# Patient Record
Sex: Female | Born: 1940 | Race: White | Hispanic: No | Marital: Married | Smoking: Former smoker
Health system: Southern US, Community
[De-identification: ages and names within clinical notes are randomized; demographics above are authoritative.]

## PROBLEM LIST (undated history)

## (undated) DIAGNOSIS — I10 Essential (primary) hypertension: Secondary | ICD-10-CM

## (undated) DIAGNOSIS — F329 Major depressive disorder, single episode, unspecified: Secondary | ICD-10-CM

## (undated) DIAGNOSIS — R7303 Prediabetes: Secondary | ICD-10-CM

## (undated) DIAGNOSIS — C189 Malignant neoplasm of colon, unspecified: Secondary | ICD-10-CM

## (undated) DIAGNOSIS — F32A Depression, unspecified: Secondary | ICD-10-CM

## (undated) HISTORY — PX: BREAST BIOPSY: SHX20

---
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
## 2008-12-06 ENCOUNTER — Ambulatory Visit: Payer: Self-pay | Admitting: Internal Medicine

## 2009-12-13 ENCOUNTER — Ambulatory Visit: Payer: Self-pay | Admitting: Internal Medicine

## 2010-08-16 ENCOUNTER — Ambulatory Visit: Payer: Self-pay | Admitting: Gastroenterology

## 2011-01-02 ENCOUNTER — Ambulatory Visit: Payer: Self-pay | Admitting: Internal Medicine

## 2011-12-16 ENCOUNTER — Ambulatory Visit: Payer: Self-pay | Admitting: Internal Medicine

## 2012-02-20 ENCOUNTER — Ambulatory Visit: Payer: Self-pay | Admitting: Internal Medicine

## 2013-02-22 ENCOUNTER — Ambulatory Visit: Payer: Self-pay | Admitting: Internal Medicine

## 2014-06-29 DIAGNOSIS — E538 Deficiency of other specified B group vitamins: Secondary | ICD-10-CM | POA: Insufficient documentation

## 2014-09-13 ENCOUNTER — Ambulatory Visit: Payer: Self-pay | Admitting: Internal Medicine

## 2015-02-02 DIAGNOSIS — R739 Hyperglycemia, unspecified: Secondary | ICD-10-CM | POA: Insufficient documentation

## 2015-11-13 DIAGNOSIS — Z85828 Personal history of other malignant neoplasm of skin: Secondary | ICD-10-CM | POA: Insufficient documentation

## 2016-07-16 ENCOUNTER — Other Ambulatory Visit: Payer: Self-pay | Admitting: Internal Medicine

## 2016-07-16 DIAGNOSIS — Z1231 Encounter for screening mammogram for malignant neoplasm of breast: Secondary | ICD-10-CM

## 2016-08-06 ENCOUNTER — Ambulatory Visit
Admission: RE | Admit: 2016-08-06 | Discharge: 2016-08-06 | Disposition: A | Discharge status: Home / Self Care | Payer: Medicare Other | Source: Ambulatory Visit | Attending: Internal Medicine | Admitting: Internal Medicine

## 2016-08-06 ENCOUNTER — Encounter: Payer: Self-pay | Admitting: Radiology

## 2016-08-06 DIAGNOSIS — Z1231 Encounter for screening mammogram for malignant neoplasm of breast: Secondary | ICD-10-CM | POA: Insufficient documentation

## 2017-10-10 ENCOUNTER — Other Ambulatory Visit: Payer: Self-pay | Admitting: Internal Medicine

## 2017-10-10 DIAGNOSIS — Z1231 Encounter for screening mammogram for malignant neoplasm of breast: Secondary | ICD-10-CM

## 2017-11-04 ENCOUNTER — Ambulatory Visit
Admission: RE | Admit: 2017-11-04 | Discharge: 2017-11-04 | Disposition: A | Discharge status: Home / Self Care | Payer: Medicare Other | Source: Ambulatory Visit | Attending: Internal Medicine | Admitting: Internal Medicine

## 2017-11-04 DIAGNOSIS — R928 Other abnormal and inconclusive findings on diagnostic imaging of breast: Secondary | ICD-10-CM | POA: Diagnosis not present

## 2017-11-04 DIAGNOSIS — Z1231 Encounter for screening mammogram for malignant neoplasm of breast: Secondary | ICD-10-CM | POA: Diagnosis present

## 2017-11-04 DIAGNOSIS — N6320 Unspecified lump in the left breast, unspecified quadrant: Secondary | ICD-10-CM | POA: Diagnosis not present

## 2017-11-07 ENCOUNTER — Other Ambulatory Visit: Payer: Self-pay | Admitting: Internal Medicine

## 2017-11-07 DIAGNOSIS — R928 Other abnormal and inconclusive findings on diagnostic imaging of breast: Secondary | ICD-10-CM

## 2017-11-07 DIAGNOSIS — N6320 Unspecified lump in the left breast, unspecified quadrant: Secondary | ICD-10-CM

## 2017-11-13 ENCOUNTER — Ambulatory Visit
Admission: RE | Admit: 2017-11-13 | Discharge: 2017-11-13 | Disposition: A | Discharge status: Home / Self Care | Payer: Medicare Other | Source: Ambulatory Visit | Attending: Internal Medicine | Admitting: Internal Medicine

## 2017-11-13 DIAGNOSIS — N6320 Unspecified lump in the left breast, unspecified quadrant: Secondary | ICD-10-CM | POA: Diagnosis present

## 2017-11-13 DIAGNOSIS — R928 Other abnormal and inconclusive findings on diagnostic imaging of breast: Secondary | ICD-10-CM

## 2017-11-13 DIAGNOSIS — N6002 Solitary cyst of left breast: Secondary | ICD-10-CM | POA: Diagnosis not present

## 2017-11-19 ENCOUNTER — Ambulatory Visit: Payer: Medicare Other

## 2017-11-19 ENCOUNTER — Other Ambulatory Visit: Payer: Medicare Other

## 2019-10-26 ENCOUNTER — Other Ambulatory Visit: Payer: Self-pay | Admitting: Internal Medicine

## 2019-10-26 DIAGNOSIS — R918 Other nonspecific abnormal finding of lung field: Secondary | ICD-10-CM

## 2019-11-04 DIAGNOSIS — E118 Type 2 diabetes mellitus with unspecified complications: Secondary | ICD-10-CM | POA: Insufficient documentation

## 2019-11-04 DIAGNOSIS — Z789 Other specified health status: Secondary | ICD-10-CM | POA: Insufficient documentation

## 2019-11-05 ENCOUNTER — Ambulatory Visit
Admission: RE | Admit: 2019-11-05 | Discharge: 2019-11-05 | Disposition: A | Discharge status: Home / Self Care | Payer: Medicare Other | Source: Ambulatory Visit | Attending: Internal Medicine | Admitting: Internal Medicine

## 2019-11-05 ENCOUNTER — Other Ambulatory Visit: Payer: Self-pay

## 2019-11-05 DIAGNOSIS — R918 Other nonspecific abnormal finding of lung field: Secondary | ICD-10-CM | POA: Diagnosis present

## 2019-11-05 HISTORY — DX: Essential (primary) hypertension: I10

## 2019-11-05 MED ORDER — IOHEXOL 300 MG/ML  SOLN
75.0000 mL | Freq: Once | INTRAMUSCULAR | Status: AC | PRN
  Administered 2019-11-05: 75 mL via INTRAVENOUS

## 2019-11-08 ENCOUNTER — Other Ambulatory Visit: Payer: Self-pay | Admitting: Internal Medicine

## 2019-11-08 DIAGNOSIS — R160 Hepatomegaly, not elsewhere classified: Secondary | ICD-10-CM

## 2019-11-19 ENCOUNTER — Ambulatory Visit
Admission: RE | Admit: 2019-11-19 | Discharge: 2019-11-19 | Disposition: A | Discharge status: Home / Self Care | Payer: Medicare Other | Source: Ambulatory Visit | Attending: Internal Medicine | Admitting: Internal Medicine

## 2019-11-19 ENCOUNTER — Other Ambulatory Visit: Payer: Self-pay

## 2019-11-19 DIAGNOSIS — R160 Hepatomegaly, not elsewhere classified: Secondary | ICD-10-CM

## 2019-11-19 MED ORDER — IOHEXOL 300 MG/ML  SOLN
100.0000 mL | Freq: Once | INTRAMUSCULAR | Status: AC | PRN
  Administered 2019-11-19: 13:00:00 100 mL via INTRAVENOUS

## 2019-11-25 ENCOUNTER — Inpatient Hospital Stay: Payer: Medicare Other

## 2019-11-25 ENCOUNTER — Other Ambulatory Visit: Payer: Self-pay

## 2019-11-25 ENCOUNTER — Telehealth: Payer: Self-pay | Admitting: Emergency Medicine

## 2019-11-25 ENCOUNTER — Inpatient Hospital Stay: Payer: Medicare Other | Attending: Oncology | Admitting: Oncology

## 2019-11-25 VITALS — BP 138/85 | HR 88 | Temp 97.5°F | Resp 18 | Wt 209.9 lb

## 2019-11-25 DIAGNOSIS — I10 Essential (primary) hypertension: Secondary | ICD-10-CM | POA: Insufficient documentation

## 2019-11-25 DIAGNOSIS — M549 Dorsalgia, unspecified: Secondary | ICD-10-CM | POA: Insufficient documentation

## 2019-11-25 DIAGNOSIS — R110 Nausea: Secondary | ICD-10-CM | POA: Insufficient documentation

## 2019-11-25 DIAGNOSIS — R160 Hepatomegaly, not elsewhere classified: Secondary | ICD-10-CM | POA: Insufficient documentation

## 2019-11-25 DIAGNOSIS — C787 Secondary malignant neoplasm of liver and intrahepatic bile duct: Secondary | ICD-10-CM | POA: Insufficient documentation

## 2019-11-25 DIAGNOSIS — R918 Other nonspecific abnormal finding of lung field: Secondary | ICD-10-CM | POA: Diagnosis present

## 2019-11-25 DIAGNOSIS — C189 Malignant neoplasm of colon, unspecified: Secondary | ICD-10-CM | POA: Insufficient documentation

## 2019-11-25 DIAGNOSIS — G4700 Insomnia, unspecified: Secondary | ICD-10-CM | POA: Diagnosis not present

## 2019-11-25 DIAGNOSIS — R634 Abnormal weight loss: Secondary | ICD-10-CM | POA: Diagnosis not present

## 2019-11-25 DIAGNOSIS — R630 Anorexia: Secondary | ICD-10-CM | POA: Diagnosis not present

## 2019-11-25 DIAGNOSIS — C7800 Secondary malignant neoplasm of unspecified lung: Secondary | ICD-10-CM | POA: Insufficient documentation

## 2019-11-25 DIAGNOSIS — F329 Major depressive disorder, single episode, unspecified: Secondary | ICD-10-CM | POA: Insufficient documentation

## 2019-11-25 DIAGNOSIS — S8290XA Unspecified fracture of unspecified lower leg, initial encounter for closed fracture: Secondary | ICD-10-CM | POA: Insufficient documentation

## 2019-11-25 DIAGNOSIS — F32A Depression, unspecified: Secondary | ICD-10-CM | POA: Insufficient documentation

## 2019-11-25 DIAGNOSIS — E7800 Pure hypercholesterolemia, unspecified: Secondary | ICD-10-CM | POA: Insufficient documentation

## 2019-11-25 DIAGNOSIS — K5732 Diverticulitis of large intestine without perforation or abscess without bleeding: Secondary | ICD-10-CM | POA: Insufficient documentation

## 2019-11-25 LAB — CBC WITH DIFFERENTIAL/PLATELET
Abs Immature Granulocytes: 0.05 10*3/uL (ref 0.00–0.07)
Basophils Absolute: 0 10*3/uL (ref 0.0–0.1)
Basophils Relative: 0 %
Eosinophils Absolute: 0 10*3/uL (ref 0.0–0.5)
Eosinophils Relative: 0 %
HCT: 38.5 % (ref 36.0–46.0)
Hemoglobin: 11.9 g/dL — ABNORMAL LOW (ref 12.0–15.0)
Immature Granulocytes: 0 %
Lymphocytes Relative: 12 %
Lymphs Abs: 1.5 10*3/uL (ref 0.7–4.0)
MCH: 25.8 pg — ABNORMAL LOW (ref 26.0–34.0)
MCHC: 30.9 g/dL (ref 30.0–36.0)
MCV: 83.3 fL (ref 80.0–100.0)
Monocytes Absolute: 1.2 10*3/uL — ABNORMAL HIGH (ref 0.1–1.0)
Monocytes Relative: 10 %
Neutro Abs: 9.6 10*3/uL — ABNORMAL HIGH (ref 1.7–7.7)
Neutrophils Relative %: 78 %
Platelets: 263 10*3/uL (ref 150–400)
RBC: 4.62 MIL/uL (ref 3.87–5.11)
RDW: 14.2 % (ref 11.5–15.5)
WBC: 12.4 10*3/uL — ABNORMAL HIGH (ref 4.0–10.5)
nRBC: 0 % (ref 0.0–0.2)

## 2019-11-25 LAB — COMPREHENSIVE METABOLIC PANEL
ALT: 51 U/L — ABNORMAL HIGH (ref 0–44)
AST: 61 U/L — ABNORMAL HIGH (ref 15–41)
Albumin: 3.1 g/dL — ABNORMAL LOW (ref 3.5–5.0)
Alkaline Phosphatase: 215 U/L — ABNORMAL HIGH (ref 38–126)
Anion gap: 12 (ref 5–15)
BUN: 16 mg/dL (ref 8–23)
CO2: 24 mmol/L (ref 22–32)
Calcium: 9.3 mg/dL (ref 8.9–10.3)
Chloride: 97 mmol/L — ABNORMAL LOW (ref 98–111)
Creatinine, Ser: 0.83 mg/dL (ref 0.44–1.00)
GFR calc Af Amer: >60 mL/min (ref >60)
GFR calc non Af Amer: >60 mL/min (ref >60)
Glucose, Bld: 151 mg/dL — ABNORMAL HIGH (ref 70–99)
Potassium: 3.5 mmol/L (ref 3.5–5.1)
Sodium: 133 mmol/L — ABNORMAL LOW (ref 135–145)
Total Bilirubin: 1.1 mg/dL (ref 0.3–1.2)
Total Protein: 7 g/dL (ref 6.5–8.1)

## 2019-11-25 LAB — PROTIME-INR
INR: 1.1 (ref 0.8–1.2)
Prothrombin Time: 13.7 seconds (ref 11.4–15.2)

## 2019-11-25 LAB — APTT: aPTT: 31 seconds (ref 24–36)

## 2019-11-25 NOTE — Progress Notes (Signed)
Pt referred from Dr. Doy Hutching, reports unintentional weighloss of approx 39 pounds over a month, weakness, N/V.

## 2019-11-25 NOTE — Addendum Note (Signed)
Addended by: Mila Merry on: 11/25/2019 11:04 AM   Modules accepted: Orders

## 2019-11-25 NOTE — Telephone Encounter (Signed)
Called pt to let her know that her liver biopsy is scheduled for 2/15 and she needs to arrive at the medical mall at [place] verbalized understanding and didn't have any further questions or concerns.

## 2019-11-25 NOTE — Progress Notes (Signed)
[facility]  [phone] [phone]  ID: Suzanne Colon OB: 02-20-1941  MR#: [id number]  [id number]  Patient Care Team: Idelle Crouch, MD as PCP - General (Internal Medicine)  CHIEF COMPLAINT: Multiple liver and pulmonary masses concerning for metastatic disease.  INTERVAL HISTORY: Patient is a 78 year old female who has noted persistent nausea over the past several months in addition to approximately 30 to 40 pound unintentional weight loss.  Subsequent imaging revealed multiple pulmonary and liver lesions concerning for metastatic disease.  She had no neurologic complaints.  She denies any recent fevers or illnesses.  She has a poor appetite.  She has no chest pain, shortness of breath, cough, or hemoptysis.  She denies any vomiting, constipation but does admit to occasional diarrhea.  She had mild blood in her stools which she attributes to hemorrhoids.  She has no urinary complaints.  Patient offers no further specific complaints today.  REVIEW OF SYSTEMS:   Review of Systems  Constitutional: Positive for malaise/fatigue and weight loss. Negative for fever.  Respiratory: Negative.  Negative for cough, hemoptysis and shortness of breath.   Cardiovascular: Negative.  Negative for chest pain and leg swelling.  Gastrointestinal: Positive for blood in stool, diarrhea and nausea. Negative for abdominal pain, constipation, melena and vomiting.  Genitourinary: Negative.  Negative for dysuria and hematuria.  Musculoskeletal: Negative.  Negative for back pain.  Skin: Negative.  Negative for rash.  Neurological: Positive for weakness. Negative for dizziness, focal weakness and headaches.  Psychiatric/Behavioral: Negative.  The patient is not nervous/anxious.     As per HPI. Otherwise, a complete review of systems is negative.  PAST MEDICAL HISTORY: Past Medical History:  Diagnosis Date  . Hypertension     PAST SURGICAL HISTORY: Past  Surgical History:  Procedure Laterality Date  . BREAST BIOPSY N/A 30 + years   neg    FAMILY HISTORY: No family history on file.  ADVANCED DIRECTIVES (Y/N):  N  HEALTH MAINTENANCE: Social History   Tobacco Use  . Smoking status: Not on file  Substance Use Topics  . Alcohol use: Not on file  . Drug use: Not on file     Colonoscopy:  PAP:  Bone density:  Lipid panel:  Allergies  Allergen Reactions  . Atorvastatin Other (See Comments)    Flu like symptoms  . Clarithromycin Other (See Comments)  . Codeine Rash  . Sulfa Antibiotics Rash    Current Outpatient Medications  Medication Sig Dispense Refill  . amLODipine (NORVASC) 10 MG tablet TAKE 1 TABLET DAILY    . aspirin 81 MG EC tablet Take by mouth.    Marland Kitchen atenolol (TENORMIN) 50 MG tablet TAKE 1 TABLET DAILY    . Biotin 5 MG CAPS Take by mouth.    Marland Kitchen buPROPion (WELLBUTRIN) 75 MG tablet TAKE 1 TABLET DAILY    . ezetimibe (ZETIA) 10 MG tablet Take by mouth.    Marland Kitchen glimepiride (AMARYL) 2 MG tablet Take by mouth.    . hydrocortisone (ANUSOL-HC) 25 MG suppository Place 25 mg rectally 2 (two) times daily.    . hydrocortisone 2.5 % cream APPLY TO AFFECTED AREA TWICE A DAY FOR 7 DAYS    . ibuprofen (ADVIL) 200 MG tablet Take by mouth.    Marland Kitchen ketoconazole (NIZORAL) 2 % cream Apply topically.    . ondansetron (ZOFRAN) 4 MG tablet Take by mouth.    . sertraline (ZOLOFT) 100 MG tablet Take by mouth.    . vitamin B-12 (CYANOCOBALAMIN)  100 MCG tablet Take by mouth.     No current facility-administered medications for this visit.    OBJECTIVE: Vitals:   11/25/19 0835  BP: 138/85  Pulse: 88  Resp: 18  Temp: (!) 97.5 F (36.4 C)  SpO2: 98%     There is no height or weight on file to calculate BMI.    ECOG FS:1 - Symptomatic but completely ambulatory  General: Well-developed, well-nourished, no acute distress. Eyes: Pink conjunctiva, anicteric sclera. HEENT: Normocephalic, moist mucous membranes. Lungs: No audible wheezing or  coughing. Heart: Regular rate and rhythm. Abdomen: Soft, nontender, no obvious distention. Musculoskeletal: No edema, cyanosis, or clubbing. Neuro: Alert, answering all questions appropriately. Cranial nerves grossly intact. Skin: No rashes or petechiae noted. Psych: Normal affect. Lymphatics: No cervical, calvicular, axillary or inguinal LAD.   LAB RESULTS:  No results found for: NA, K, CL, CO2, GLUCOSE, BUN, CREATININE, CALCIUM, PROT, ALBUMIN, AST, ALT, ALKPHOS, BILITOT, GFRNONAA, GFRAA  No results found for: WBC, NEUTROABS, HGB, HCT, MCV, PLT   STUDIES: CT CHEST W CONTRAST  Result Date: 11/05/2019 CLINICAL DATA:  Follow-up lung nodules EXAM: CT CHEST WITH CONTRAST TECHNIQUE: Multidetector CT imaging of the chest was performed during intravenous contrast administration. CONTRAST:  43mL OMNIPAQUE IOHEXOL 300 MG/ML  SOLN COMPARISON:  Chest radiograph, 10/21/2019 FINDINGS: Cardiovascular: Aortic atherosclerosis. Normal heart size. Three-vessel coronary artery calcifications. No pericardial effusion. Mediastinum/Nodes: No enlarged mediastinal, hilar, or axillary lymph nodes. Thyroid gland, trachea, and esophagus demonstrate no significant findings. Lungs/Pleura: There are numerous small bilateral pulmonary nodules, measuring up to 0.8 cm, many with internal cavitation, for example an 8 mm nodule of the left upper lobe (series 3, image 48). No pleural effusion or pneumothorax. Upper Abdomen: There are numerous hypodense lesion of the included liver. Musculoskeletal: No chest wall mass or suspicious bone lesions identified. IMPRESSION: 1. Numerous small bilateral pulmonary nodules measuring up to 0.8 cm, many with internal cavitation. Findings are suspicious for metastatic disease. 2. Numerous hypodense lesion of the included liver, suspicious for metastatic disease. 3. Recommend CT examination of the abdomen and pelvis for further staging. 4. Three-vessel coronary artery calcifications. 5. Aortic  Atherosclerosis (ICD10-I70.0). These results will be called to the ordering clinician or representative by the Radiologist Assistant, and communication documented in the PACS or zVision Dashboard. Electronically Signed   By: Eddie Candle M.D.   On: 11/05/2019 15:22   CT ABDOMEN W WO CONTRAST  Result Date: 11/19/2019 CLINICAL DATA:  Liver masses, follow-up chest CT worsening nausea and unexplained 40 pound weight loss EXAM: CT ABDOMEN WITHOUT AND WITH CONTRAST TECHNIQUE: Multidetector CT imaging of the abdomen was performed following the standard protocol before and following the bolus administration of intravenous contrast. CONTRAST:  155mL OMNIPAQUE IOHEXOL 300 MG/ML  SOLN COMPARISON:  None. FINDINGS: Lower chest: Multiple pulmonary nodules. Also seen on recent chest CT. Hepatobiliary: Numerous hepatic lesions infiltrating the liver showing hypovascular characteristics. All hepatic subsegment are involved. (Image 66, series 4) 5.0 x 3.6 cm lesion in the central right hepatic lobe adjacent the porta hepatis. (Image 40, series 4) 5.0 x 3.1 cm lesion in the peripheral right hepatic lobe along the margin of hepatic subsegment VIII. Numerous lesions also present in the left hepatic lobe. Largest lesion in the liver is in the inferior right hepatic lobe on series 4, image 90 measuring 6.3 x 4.8 cm. Portal vein is patent. Gallstone in the gallbladder fundus, large with gallbladder contracted around the gallstone and mild gallbladder wall thickening circumferentially. No signs of pericholecystic  stranding or biliary ductal dilation. Pancreas: Pancreas is normal. Spleen: Spleen normal without focal lesion. Adrenals/Urinary Tract: CT appearance of adrenal glands and kidneys is normal no hydronephrosis, symmetric renal enhancement. Stomach/Bowel: Is no signs of acute gastrointestinal process. Pelvic bowel loops are not imaged. Signs of colonic diverticulosis. Vascular/Lymphatic: Calcified atherosclerotic plaque throughout  the abdominal aorta. No signs of aneurysm. No signs of adenopathy in the retroperitoneum or upper abdomen. Other: No signs of ascites. Musculoskeletal: Spinal degenerative change. No acute or destructive bone process. IMPRESSION: 1. Numerous hepatic lesions infiltrating the majority of the liver, compatible with metastatic disease. 2. Multiple pulmonary nodules also suspicious for metastatic disease as seen on recent CT chest. 3. Cholelithiasis with large gallstone in the gallbladder fundus. 4. Colonic diverticulosis. 5. Aortic atherosclerosis. Aortic Atherosclerosis (ICD10-I70.0). Electronically Signed   By: Zetta Bills M.D.   On: 11/19/2019 14:41    ASSESSMENT: Multiple liver and pulmonary masses concerning for metastatic disease.  PLAN:    1. Multiple liver and pulmonary masses concerning for metastatic disease: CT scan results from January 15 and November 19, 2019 reviewed independently and reported as above with multiple hepatic and pulmonary lesions compatible with metastatic disease.  Will order ultrasound-guided biopsy of liver to confirm diagnosis.  Have also ordered tumor markers for today.  Patient will return to clinic approximately 1 week after her biopsy for discussion of her pathology and treatment planning. 2.  Nausea: Continue ondansetron as needed. 3.  Weight loss: Patient will likely benefit from dietary consult in the near future.  I spent a total of 60 minutes reviewing chart data, face-to-face evaluation with the patient, counseling and coordination of care as detailed above.   Patient expressed understanding and was in agreement with this plan. She also understands that She can call clinic at any time with any questions, concerns, or complaints.   Cancer Staging No matching staging information was found for the patient.  Lloyd Huger, MD   11/25/2019 9:29 AM

## 2019-11-26 LAB — CANCER ANTIGEN 27.29: CA 27.29: 56.8 U/mL — ABNORMAL HIGH (ref 0.0–38.6)

## 2019-11-30 LAB — CA 125: Cancer Antigen (CA) 125: 76.6 U/mL — ABNORMAL HIGH (ref 0.0–38.1)

## 2019-11-30 LAB — AFP TUMOR MARKER: AFP, Serum, Tumor Marker: 3.3 ng/mL (ref 0.0–8.3)

## 2019-12-02 ENCOUNTER — Other Ambulatory Visit: Payer: Self-pay | Admitting: Radiology

## 2019-12-03 ENCOUNTER — Other Ambulatory Visit: Payer: Self-pay | Admitting: Radiology

## 2019-12-06 ENCOUNTER — Other Ambulatory Visit: Payer: Self-pay

## 2019-12-06 ENCOUNTER — Ambulatory Visit
Admission: RE | Admit: 2019-12-06 | Discharge: 2019-12-06 | Disposition: A | Discharge status: Home / Self Care | Payer: Medicare Other | Source: Ambulatory Visit | Attending: Oncology | Admitting: Oncology

## 2019-12-06 DIAGNOSIS — I10 Essential (primary) hypertension: Secondary | ICD-10-CM | POA: Diagnosis not present

## 2019-12-06 DIAGNOSIS — C787 Secondary malignant neoplasm of liver and intrahepatic bile duct: Secondary | ICD-10-CM | POA: Diagnosis not present

## 2019-12-06 DIAGNOSIS — C801 Malignant (primary) neoplasm, unspecified: Secondary | ICD-10-CM | POA: Insufficient documentation

## 2019-12-06 DIAGNOSIS — Z6834 Body mass index (BMI) 34.0-34.9, adult: Secondary | ICD-10-CM | POA: Insufficient documentation

## 2019-12-06 DIAGNOSIS — Z79899 Other long term (current) drug therapy: Secondary | ICD-10-CM | POA: Insufficient documentation

## 2019-12-06 DIAGNOSIS — Z7984 Long term (current) use of oral hypoglycemic drugs: Secondary | ICD-10-CM | POA: Insufficient documentation

## 2019-12-06 DIAGNOSIS — R160 Hepatomegaly, not elsewhere classified: Secondary | ICD-10-CM

## 2019-12-06 DIAGNOSIS — C7800 Secondary malignant neoplasm of unspecified lung: Secondary | ICD-10-CM | POA: Insufficient documentation

## 2019-12-06 DIAGNOSIS — Z7982 Long term (current) use of aspirin: Secondary | ICD-10-CM | POA: Diagnosis not present

## 2019-12-06 DIAGNOSIS — E669 Obesity, unspecified: Secondary | ICD-10-CM | POA: Diagnosis not present

## 2019-12-06 HISTORY — DX: Depression, unspecified: F32.A

## 2019-12-06 HISTORY — DX: Prediabetes: R73.03

## 2019-12-06 LAB — CEA: CEA: 2985 ng/mL — ABNORMAL HIGH (ref 0.0–4.7)

## 2019-12-06 LAB — CANCER ANTIGEN 19-9: CA 19-9: 24569 U/mL — ABNORMAL HIGH (ref 0–35)

## 2019-12-06 MED ORDER — FENTANYL CITRATE (PF) 100 MCG/2ML IJ SOLN
INTRAMUSCULAR | Status: AC | PRN
  Administered 2019-12-06 (×2): 50 ug via INTRAVENOUS

## 2019-12-06 MED ORDER — HYDRALAZINE HCL 20 MG/ML IJ SOLN
10.0000 mg | Freq: Once | INTRAMUSCULAR | Status: AC
  Administered 2019-12-06: 10 mg via INTRAVENOUS

## 2019-12-06 MED ORDER — FENTANYL CITRATE (PF) 100 MCG/2ML IJ SOLN
INTRAMUSCULAR | Status: AC
  Filled 2019-12-06: qty 2

## 2019-12-06 MED ORDER — HYDRALAZINE HCL 20 MG/ML IJ SOLN
INTRAMUSCULAR | Status: AC
  Filled 2019-12-06: qty 1

## 2019-12-06 MED ORDER — MIDAZOLAM HCL 2 MG/2ML IJ SOLN
INTRAMUSCULAR | Status: AC
  Filled 2019-12-06: qty 4

## 2019-12-06 MED ORDER — MIDAZOLAM HCL 2 MG/2ML IJ SOLN
INTRAMUSCULAR | Status: AC | PRN
  Administered 2019-12-06 (×2): 1 mg via INTRAVENOUS

## 2019-12-06 MED ORDER — SODIUM CHLORIDE 0.9 % IV SOLN: INTRAVENOUS | Status: DC

## 2019-12-06 NOTE — Procedures (Signed)
Interventional Radiology Procedure Note  Procedure: US Guided Biopsy of liver lesion  Complications: None  Estimated Blood Loss: < 10 mL  Findings: 18 G core biopsy of liver lesion in right lobe performed under US guidance.  Three core samples obtained and sent to Pathology.  Venetia Night. Kathlene Cote, M.D Pager:  [phone]

## 2019-12-06 NOTE — OR Nursing (Signed)
Reports feeling "fullness in upper right quad." noted elevation of sbp 171/78 DM paged and made aware of above..new orders given

## 2019-12-06 NOTE — H&P (Signed)
Chief Complaint: Patient was seen in consultation today for liver biopsy at the request of Finnegan,Timothy J  Referring Physician(s): Finnegan,Timothy J  Patient Status: [facility] - Out-pt  History of Present Illness: Suzanne Colon is a 79 y.o. female presenting with several month history of weight loss, nausea and abdominal discomfort. Possible blood per rectum but has hemorrhoids. CT evaluation showed multiple confluent liver masses throughout liver and multiple small bilateral pulmonary nodules. Pelvis was not imaged. No obvious bowel lesions in abdomen. CA 27.29 and CA 125 elevated. Has not had colonoscopy or EGD.  Past Medical History:  Diagnosis Date  . Hypertension     Past Surgical History:  Procedure Laterality Date  . BREAST BIOPSY N/A 30 + years   neg    Allergies: Atorvastatin, Clarithromycin, Codeine, and Sulfa antibiotics  Medications: Prior to Admission medications   Medication Sig Start Date End Date Taking? Authorizing Provider  amLODipine (NORVASC) 10 MG tablet TAKE 1 TABLET DAILY 06/23/19  Yes [provider]  vitamin B-12 (CYANOCOBALAMIN) 100 MCG tablet Take by mouth.   Yes [provider]  aspirin 81 MG EC tablet Take by mouth.    [provider]  atenolol (TENORMIN) 50 MG tablet TAKE 1 TABLET DAILY 06/23/19   [provider]  Biotin 5 MG CAPS Take by mouth.    [provider]  buPROPion (WELLBUTRIN) 75 MG tablet TAKE 1 TABLET DAILY 06/23/19   [provider]  ezetimibe (ZETIA) 10 MG tablet Take by mouth. 11/04/19 11/03/20  [provider]  glimepiride (AMARYL) 2 MG tablet Take by mouth. 11/04/19 11/03/20  [provider]  hydrocortisone (ANUSOL-HC) 25 MG suppository Place 25 mg rectally 2 (two) times daily. 10/21/19   [provider]  hydrocortisone 2.5 % cream APPLY TO AFFECTED AREA TWICE A DAY FOR 7 DAYS 10/21/19   [provider]  ibuprofen (ADVIL) 200 MG tablet Take by  mouth.    [provider]  ketoconazole (NIZORAL) 2 % cream Apply topically.    [provider]  sertraline (ZOLOFT) 100 MG tablet Take by mouth. 01/11/19   [provider]     No family history on file.  Social History   Socioeconomic History  . Marital status: Married    Spouse name: Not on file  . Number of children: Not on file  . Years of education: Not on file  . Highest education level: Not on file  Occupational History  . Not on file  Tobacco Use  . Smoking status: Not on file  Substance and Sexual Activity  . Alcohol use: Not on file  . Drug use: Not on file  . Sexual activity: Not on file  Other Topics Concern  . Not on file  Social History Narrative  . Not on file   Social Determinants of Health   Financial Resource Strain:   . Difficulty of Paying Living Expenses: Not on file  Food Insecurity:   . Worried About Charity fundraiser in the Last Year: Not on file  . Ran Out of Food in the Last Year: Not on file  Transportation Needs:   . Lack of Transportation (Medical): Not on file  . Lack of Transportation (Non-Medical): Not on file  Physical Activity:   . Days of Exercise per Week: Not on file  . Minutes of Exercise per Session: Not on file  Stress:   . Feeling of Stress : Not on file  Social Connections:   . Frequency  of Communication with Friends and Family: Not on file  . Frequency of Social Gatherings with Friends and Family: Not on file  . Attends Religious Services: Not on file  . Active Member of Clubs or Organizations: Not on file  . Attends Archivist Meetings: Not on file  . Marital Status: Not on file    ECOG Status: 2 - Symptomatic, <50% confined to bed  Review of Systems: A 12 point ROS discussed and pertinent positives are indicated in the HPI above.  All other systems are negative.  Review of Systems  Constitutional: Positive for activity change, appetite change, fatigue and unexpected weight  change.  Respiratory: Negative.   Cardiovascular: Negative.   Gastrointestinal: Positive for abdominal pain, anal bleeding and nausea.  Genitourinary: Negative.   Musculoskeletal: Negative.   Neurological: Negative.     Vital Signs: BP 137/83   Pulse (!) 106   Temp 98 F (36.7 C) (Oral)   Resp 18   Ht 5\' 4"  (1.626 m)   Wt 90.7 kg   SpO2 98%   BMI 34.33 kg/m   Physical Exam Vitals reviewed.  Constitutional:      Appearance: She is obese. She is not toxic-appearing or diaphoretic.  Cardiovascular:     Rate and Rhythm: Normal rate and regular rhythm.     Heart sounds: Normal heart sounds. No murmur. No friction rub. No gallop.   Pulmonary:     Effort: Pulmonary effort is normal. No respiratory distress.     Breath sounds: Normal breath sounds. No stridor. No wheezing, rhonchi or rales.  Abdominal:     Tenderness: There is no abdominal tenderness. There is no guarding or rebound.  Musculoskeletal:     Cervical back: Neck supple.  Skin:    General: Skin is warm and dry.  Neurological:     General: No focal deficit present.     Mental Status: She is alert and oriented to person, place, and time.     Imaging: CT ABDOMEN W WO CONTRAST  Result Date: 11/19/2019 CLINICAL DATA:  Liver masses, follow-up chest CT worsening nausea and unexplained 40 pound weight loss EXAM: CT ABDOMEN WITHOUT AND WITH CONTRAST TECHNIQUE: Multidetector CT imaging of the abdomen was performed following the standard protocol before and following the bolus administration of intravenous contrast. CONTRAST:  120mL OMNIPAQUE IOHEXOL 300 MG/ML  SOLN COMPARISON:  None. FINDINGS: Lower chest: Multiple pulmonary nodules. Also seen on recent chest CT. Hepatobiliary: Numerous hepatic lesions infiltrating the liver showing hypovascular characteristics. All hepatic subsegment are involved. (Image 66, series 4) 5.0 x 3.6 cm lesion in the central right hepatic lobe adjacent the porta hepatis. (Image 40, series 4) 5.0 x  3.1 cm lesion in the peripheral right hepatic lobe along the margin of hepatic subsegment VIII. Numerous lesions also present in the left hepatic lobe. Largest lesion in the liver is in the inferior right hepatic lobe on series 4, image 90 measuring 6.3 x 4.8 cm. Portal vein is patent. Gallstone in the gallbladder fundus, large with gallbladder contracted around the gallstone and mild gallbladder wall thickening circumferentially. No signs of pericholecystic stranding or biliary ductal dilation. Pancreas: Pancreas is normal. Spleen: Spleen normal without focal lesion. Adrenals/Urinary Tract: CT appearance of adrenal glands and kidneys is normal no hydronephrosis, symmetric renal enhancement. Stomach/Bowel: Is no signs of acute gastrointestinal process. Pelvic bowel loops are not imaged. Signs of colonic diverticulosis. Vascular/Lymphatic: Calcified atherosclerotic plaque throughout the abdominal aorta. No signs of aneurysm. No signs of adenopathy in  the retroperitoneum or upper abdomen. Other: No signs of ascites. Musculoskeletal: Spinal degenerative change. No acute or destructive bone process. IMPRESSION: 1. Numerous hepatic lesions infiltrating the majority of the liver, compatible with metastatic disease. 2. Multiple pulmonary nodules also suspicious for metastatic disease as seen on recent CT chest. 3. Cholelithiasis with large gallstone in the gallbladder fundus. 4. Colonic diverticulosis. 5. Aortic atherosclerosis. Aortic Atherosclerosis (ICD10-I70.0). Electronically Signed   By: Zetta Bills M.D.   On: 11/19/2019 14:41    Labs:  CBC: Recent Labs    11/25/19 0913  WBC 12.4*  HGB 11.9*  HCT 38.5  PLT 263    COAGS: Recent Labs    11/25/19 0913  INR 1.1  APTT 31    BMP: Recent Labs    11/25/19 0913  NA 133*  K 3.5  CL 97*  CO2 24  GLUCOSE 151*  BUN 16  CALCIUM 9.3  CREATININE 0.83  GFRNONAA >60  GFRAA >60    LIVER FUNCTION TESTS: Recent Labs    11/25/19 0913  BILITOT  1.1  AST 61*  ALT 51*  ALKPHOS 215*  PROT 7.0  ALBUMIN 3.1*     Assessment and Plan:  For US guided core biopsy of liver lesion today. Risks and benefits of liver biopsy was discussed with the patient and/or patient's family including, but not limited to bleeding, infection, damage to adjacent structures or low yield requiring additional tests.  All of the questions were answered and there is agreement to proceed.  Consent signed and in chart.  Thank you for this interesting consult.  I greatly enjoyed meeting Suzanne Colon and look forward to participating in their care.  A copy of this report was sent to the requesting provider on this date.  Electronically Signed: Azzie Roup, MD 12/06/2019, 9:49 AM     I spent a total of 15 Minutes in face to face in clinical consultation, greater than 50% of which was counseling/coordinating care for liver biopsy.

## 2019-12-06 NOTE — Discharge Instructions (Signed)
Needle Biopsy of the Bone A needle biopsy of the bone is a procedure in which a small sample of bone is removed. The sample is taken with a needle. Then, the bone sample is looked at under a microscope to check for abnormalities. The sample is usually taken from a bone that is close to the skin. This procedure may be done to check for various problems with the bone. You may need this procedure if imaging tests or blood tests have indicated a possible problem. This procedure may be done to help determine if a bone tumor is cancerous (malignant). A bone biopsy can help to diagnose problems such as:  Tumors of the bone (sarcomas) and bone marrow (multiple myeloma).  Bone that forms abnormally (Paget's disease).  Noncancerous (benign) bone cysts.  Bony growths.  Infections in the bone. Tell a health care provider about:  Any allergies you have.  All medicines you are taking, including vitamins, herbs, eye drops, creams, and over-the-counter medicines.  Any problems you or family members have had with anesthetic medicines.  Any blood disorders you have.  Any surgeries you have had.  Any medical conditions you have. What are the risks? Generally, this is a safe procedure. However, problems may occur, including:  Excessive bleeding.  Infection.  Injury to surrounding tissue. What happens before the procedure? Medicines Ask your health care provider about:  Changing or stopping your regular medicines. This is especially important if you are taking diabetes medicines or blood thinners.  Taking medicines such as aspirin and ibuprofen. These medicines can thin your blood. Do not take these medicines unless your health care provider tells you to take them.  Taking over-the-counter medicines, vitamins, herbs, and supplements. General instructions  Follow instructions from your health care provider about eating or drinking restrictions.  Plan to have someone take you home from the  hospital or clinic.  If you will be going home right after the procedure, plan to have someone with you for 24 hours. What happens during the procedure?   An IV may be inserted into one of your veins.  The injection site will be cleaned with a germ-killing solution (antiseptic).  You may be given one or more of the following: ? A medicine to help you relax (sedative). ? A medicine to numb the area (local anesthetic).  The sample of bone will be removed by inserting a large needle through the skin and into the bone.  The needle will be removed.  Tissue from the needle will be sent to a laboratory to be analyzed.  A bandage (dressing) will be placed over the insertion site. The procedure may vary among health care providers and hospitals. What happens after the procedure?  Your blood pressure, heart rate, breathing rate, and blood oxygen level may be monitored until you leave the hospital or clinic.  Do not drive for 24 hours if you were given a sedative during your procedure.  You may have numbness and loss of feeling in the area for a few hours after the test.  It is up to you to get the results of your procedure. Ask your health care provider, or the department that is doing the procedure, when your results will be ready. Summary  A needle biopsy of the bone is a procedure in which a small sample of bone is removed with a needle.  This procedure may be done to check for various problems with the bone.  Ask your health care provider about changing or  stopping your regular medicines before the procedure.  Ask your health care provider, or the department that is doing the procedure, when your results will be ready. This information is not intended to replace advice given to you by your health care provider. Make sure you discuss any questions you have with your health care provider. Document Revised: 06/18/2018 Document Reviewed: 06/18/2018 Elsevier Patient Education  2020  [employer].

## 2019-12-06 NOTE — OR Nursing (Signed)
Multiple offers for food and drink declined, patient declines to sit up

## 2019-12-08 ENCOUNTER — Other Ambulatory Visit: Payer: Self-pay | Admitting: Anatomic Pathology & Clinical Pathology

## 2019-12-09 ENCOUNTER — Encounter: Payer: Self-pay | Admitting: Oncology

## 2019-12-10 ENCOUNTER — Encounter: Payer: Self-pay | Admitting: Oncology

## 2019-12-10 ENCOUNTER — Inpatient Hospital Stay (HOSPITAL_BASED_OUTPATIENT_CLINIC_OR_DEPARTMENT_OTHER): Payer: Medicare Other | Admitting: Oncology

## 2019-12-10 ENCOUNTER — Other Ambulatory Visit: Payer: Self-pay

## 2019-12-10 ENCOUNTER — Inpatient Hospital Stay: Payer: Medicare Other | Admitting: Oncology

## 2019-12-10 DIAGNOSIS — C189 Malignant neoplasm of colon, unspecified: Secondary | ICD-10-CM | POA: Diagnosis not present

## 2019-12-10 DIAGNOSIS — Z7189 Other specified counseling: Secondary | ICD-10-CM

## 2019-12-10 MED ORDER — HYDROCODONE-ACETAMINOPHEN 5-325 MG PO TABS: 1.0000 | ORAL_TABLET | Freq: Four times a day (QID) | ORAL | 0 refills | Status: DC | PRN

## 2019-12-10 MED ORDER — ONDANSETRON HCL 8 MG PO TABS: 8.0000 mg | ORAL_TABLET | Freq: Three times a day (TID) | ORAL | 1 refills | Status: DC | PRN

## 2019-12-10 MED ORDER — DEXAMETHASONE 4 MG PO TABS: 4.0000 mg | ORAL_TABLET | Freq: Two times a day (BID) | ORAL | 1 refills | Status: DC

## 2019-12-10 NOTE — Progress Notes (Signed)
[facility]  Telephone:([phone]) [phone] Fax:(336) [phone]  ID: Suzanne Colon OB: [birth year]  MR#: [id number]  VZD#:[id number]  Patient Care Team: Idelle Crouch, MD as PCP - General (Internal Medicine)  I connected with Suzanne Colon on 12/10/19 at  1:45 PM EST by video enabled telemedicine visit and verified that I am speaking with the correct person using two identifiers.   I discussed the limitations, risks, security and privacy concerns of performing an evaluation and management service by telemedicine and the availability of in-person appointments. I also discussed with the patient that there may be a patient responsible charge related to this service. The patient expressed understanding and agreed to proceed.   Other persons participating in the visit and their role in the encounter: Patient, patient's daughter, MD.  Patient's location: Home. Provider's location: Clinic.  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  INTERVAL HISTORY: Patient agreed to video enabled telemedicine visit to discuss her pathology results and further evaluation.  She has a declining performance status.  She has persistent nausea and a poor appetite.  She has insomnia and persistent abdominal discomfort/pain. She had no neurologic complaints.  She denies any recent fevers or illnesses.  She has no chest pain, shortness of breath, cough, or hemoptysis.  She has small frequent bowel movements, but does not endorse diarrhea.  She has no urinary complaints.  Patient feels generally terrible, but offers no further specific complaints today.  REVIEW OF SYSTEMS:   Review of Systems  Constitutional: Positive for malaise/fatigue and weight loss. Negative for fever.  Respiratory: Negative.  Negative for cough, hemoptysis and shortness of breath.   Cardiovascular: Negative.  Negative for chest pain and leg swelling.  Gastrointestinal: Positive for  abdominal pain and nausea. Negative for blood in stool, constipation, diarrhea, melena and vomiting.  Genitourinary: Negative.  Negative for dysuria and hematuria.  Musculoskeletal: Negative.  Negative for back pain.  Skin: Negative.  Negative for rash.  Neurological: Positive for weakness. Negative for dizziness, focal weakness and headaches.  Psychiatric/Behavioral: The patient has insomnia. The patient is not nervous/anxious.     As per HPI. Otherwise, a complete review of systems is negative.  PAST MEDICAL HISTORY: Past Medical History:  Diagnosis Date  . Depression   . Hypertension   . Pre-diabetes     PAST SURGICAL HISTORY: Past Surgical History:  Procedure Laterality Date  . BREAST BIOPSY N/A 30 + years   neg    FAMILY HISTORY: History reviewed. No pertinent family history.  ADVANCED DIRECTIVES (Y/N):  N  HEALTH MAINTENANCE: Social History   Tobacco Use  . Smoking status: Former Research scientist (life sciences)  . Smokeless tobacco: Never Used  . Tobacco comment: 1990's  Substance Use Topics  . Alcohol use: Not Currently  . Drug use: Not Currently     Colonoscopy:  PAP:  Bone density:  Lipid panel:  Allergies  Allergen Reactions  . Atorvastatin Other (See Comments)    Flu like symptoms  . Clarithromycin Other (See Comments)  . Codeine Rash  . Sulfa Antibiotics Rash    Current Outpatient Medications  Medication Sig Dispense Refill  . amLODipine (NORVASC) 10 MG tablet TAKE 1 TABLET DAILY    . aspirin 81 MG EC tablet Take by mouth.    Marland Kitchen atenolol (TENORMIN) 50 MG tablet TAKE 1 TABLET DAILY    . Biotin 5 MG CAPS Take by mouth.    Marland Kitchen buPROPion (WELLBUTRIN) 75 MG tablet TAKE 1 TABLET  DAILY    . dexamethasone (DECADRON) 4 MG tablet Take 1 tablet (4 mg total) by mouth 2 (two) times daily with a meal. 30 tablet 1  . ezetimibe (ZETIA) 10 MG tablet Take by mouth.    Marland Kitchen glimepiride (AMARYL) 2 MG tablet Take by mouth.    Marland Kitchen HYDROcodone-acetaminophen (NORCO) 5-325 MG tablet Take 1 tablet  by mouth every 6 (six) hours as needed for moderate pain. 60 tablet 0  . hydrocortisone (ANUSOL-HC) 25 MG suppository Place 25 mg rectally 2 (two) times daily.    . hydrocortisone 2.5 % cream APPLY TO AFFECTED AREA TWICE A DAY FOR 7 DAYS    . ibuprofen (ADVIL) 200 MG tablet Take by mouth.    Marland Kitchen ketoconazole (NIZORAL) 2 % cream Apply topically.    . ondansetron (ZOFRAN) 8 MG tablet Take 1 tablet (8 mg total) by mouth every 8 (eight) hours as needed for nausea or vomiting. 60 tablet 1  . sertraline (ZOLOFT) 100 MG tablet Take by mouth.    . vitamin B-12 (CYANOCOBALAMIN) 100 MCG tablet Take by mouth.     No current facility-administered medications for this visit.    OBJECTIVE: Vitals:     There is no height or weight on file to calculate BMI.    ECOG FS:3 - Symptomatic, >50% confined to bed  General: Ill-appearing, no acute distress. HEENT: Normocephalic. Neuro: Alert, answering all questions appropriately. Cranial nerves grossly intact. Psych: Normal affect.  LAB RESULTS:  Lab Results  Component Value Date   NA 133 (L) 11/25/2019   K 3.5 11/25/2019   CL 97 (L) 11/25/2019   CO2 24 11/25/2019   GLUCOSE 151 (H) 11/25/2019   BUN 16 11/25/2019   CREATININE 0.83 11/25/2019   CALCIUM 9.3 11/25/2019   PROT 7.0 11/25/2019   ALBUMIN 3.1 (L) 11/25/2019   AST 61 (H) 11/25/2019   ALT 51 (H) 11/25/2019   ALKPHOS 215 (H) 11/25/2019   BILITOT 1.1 11/25/2019   GFRNONAA >60 11/25/2019   GFRAA >60 11/25/2019    Lab Results  Component Value Date   WBC 12.4 (H) 11/25/2019   NEUTROABS 9.6 (H) 11/25/2019   HGB 11.9 (L) 11/25/2019   HCT 38.5 11/25/2019   MCV 83.3 11/25/2019   PLT 263 11/25/2019     STUDIES: US BIOPSY (LIVER)  Result Date: 12/06/2019 INDICATION: Weight loss, nausea, multiple confluent masses throughout the liver parenchyma and bilateral pulmonary nodules. The patient presents for liver lesion BIOPSY. EXAM: ULTRASOUND GUIDED CORE BIOPSY OF LIVER MEDICATIONS: None.  ANESTHESIA/SEDATION: Fentanyl 2.0 mcg IV; Versed 100 mg IV Moderate Sedation Time:  21 minutes. The patient was continuously monitored during the procedure by the interventional radiology nurse under my direct supervision. PROCEDURE: The procedure, risks, benefits, and alternatives were explained to the patient. Questions regarding the procedure were encouraged and answered. The patient understands and consents to the procedure. A time-out was performed prior to initiating the procedure. Ultrasound was performed of the liver. The right abdominal wall was prepped with chlorhexidine in a sterile fashion, and a sterile drape was applied covering the operative field. A sterile gown and sterile gloves were used for the procedure. Local anesthesia was provided with 1% Lidocaine. Under ultrasound guidance, a 17 gauge trocar needle was advanced into the inferior aspect of the right lobe of the liver. After confirming needle tip position, coaxial 18 gauge core biopsy samples were obtained. Three total core biopsy samples were submitted in formalin. Additional post biopsy ultrasound was performed after outer needle removal.  COMPLICATIONS: None immediate. FINDINGS: The liver is filled with ill-defined confluent relatively hyperechoic mass lesions that blend into one another and are difficult to discretely measure. Area of abnormal tissue in the inferior right lobe was targeted with solid tissue obtained. IMPRESSION: Ultrasound-guided core biopsy performed lesion within the inferior right lobe parenchyma. Multiple ill-defined confluent masses blend into one another by ultrasound and are relatively hyperechoic. Electronically Signed   By: Aletta Edouard M.D.   On: 12/06/2019 11:18   CT ABDOMEN W WO CONTRAST  Result Date: 11/19/2019 CLINICAL DATA:  Liver masses, follow-up chest CT worsening nausea and unexplained 40 pound weight loss EXAM: CT ABDOMEN WITHOUT AND WITH CONTRAST TECHNIQUE: Multidetector CT imaging of the  abdomen was performed following the standard protocol before and following the bolus administration of intravenous contrast. CONTRAST:  171m OMNIPAQUE IOHEXOL 300 MG/ML  SOLN COMPARISON:  None. FINDINGS: Lower chest: Multiple pulmonary nodules. Also seen on recent chest CT. Hepatobiliary: Numerous hepatic lesions infiltrating the liver showing hypovascular characteristics. All hepatic subsegment are involved. (Image 66, series 4) 5.0 x 3.6 cm lesion in the central right hepatic lobe adjacent the porta hepatis. (Image 40, series 4) 5.0 x 3.1 cm lesion in the peripheral right hepatic lobe along the margin of hepatic subsegment VIII. Numerous lesions also present in the left hepatic lobe. Largest lesion in the liver is in the inferior right hepatic lobe on series 4, image 90 measuring 6.3 x 4.8 cm. Portal vein is patent. Gallstone in the gallbladder fundus, large with gallbladder contracted around the gallstone and mild gallbladder wall thickening circumferentially. No signs of pericholecystic stranding or biliary ductal dilation. Pancreas: Pancreas is normal. Spleen: Spleen normal without focal lesion. Adrenals/Urinary Tract: CT appearance of adrenal glands and kidneys is normal no hydronephrosis, symmetric renal enhancement. Stomach/Bowel: Is no signs of acute gastrointestinal process. Pelvic bowel loops are not imaged. Signs of colonic diverticulosis. Vascular/Lymphatic: Calcified atherosclerotic plaque throughout the abdominal aorta. No signs of aneurysm. No signs of adenopathy in the retroperitoneum or upper abdomen. Other: No signs of ascites. Musculoskeletal: Spinal degenerative change. No acute or destructive bone process. IMPRESSION: 1. Numerous hepatic lesions infiltrating the majority of the liver, compatible with metastatic disease. 2. Multiple pulmonary nodules also suspicious for metastatic disease as seen on recent CT chest. 3. Cholelithiasis with large gallstone in the gallbladder fundus. 4. Colonic  diverticulosis. 5. Aortic atherosclerosis. Aortic Atherosclerosis (ICD10-I70.0). Electronically Signed   By: GZetta BillsM.D.   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  PLAN:    1. 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  Ultrasound-guided liver biopsy on December 06, 2019 confirmed adenocarcinoma likely of colon origin.  Will order K-ras/EGFR mutation.  Patient's performance status is rapidly declining, but she did express some interest in pursuing palliative chemotherapy.  Return to clinic on December 16, 2019 for further evaluation and treatment planning.  Will also arrange palliative care consult at that time. 2.  Nausea: Continue ondansetron and patient was given a prescription for dexamethasone. 3.  Poor appetite/weight loss: Dexamethasone as above.  Patient will also have consultation with dietary when she returns to clinic.   4.  Insomnia: Patient reports she is sleeping mostly during the day and then awake all night, dexamethasone as above to improve  appetite and wakefulness during the day.  Can consider alprazolam in the future if necessary. 5.  Frequent bowel movements: The patient has not had a colonoscopy, it is likely she has a colon mass disrupting her normal motility.  Recommended daily stool softeners and continue to monitor closely.   6.  Abdominal pain: Patient was given a prescription for Vicodin today.  I provided 30 minutes of face-to-face video visit time during this encounter which included chart review, counseling, and coordination of care as documented above.   Patient expressed understanding and was in agreement with this plan. She also understands that She can call clinic at any time with any  questions, concerns, or complaints.   Cancer Staging Adenocarcinoma of colon [facility]) Staging form: Colon and Rectum, AJCC 8th Edition - Clinical stage from 12/10/2019: Stage IVB (cTX, cNX, pM1b) - Signed by Lloyd Huger, MD on 12/10/2019   Lloyd Huger, MD   12/10/2019 4:05 PM

## 2019-12-12 ENCOUNTER — Encounter: Payer: Self-pay | Admitting: Oncology

## 2019-12-13 NOTE — Telephone Encounter (Signed)
Yes, I can see her on Thursday.  Please add her to my schedule and double book if needed.

## 2019-12-15 ENCOUNTER — Other Ambulatory Visit: Payer: Self-pay | Admitting: Emergency Medicine

## 2019-12-15 DIAGNOSIS — C189 Malignant neoplasm of colon, unspecified: Secondary | ICD-10-CM

## 2019-12-16 ENCOUNTER — Encounter: Payer: Self-pay | Admitting: Oncology

## 2019-12-16 ENCOUNTER — Inpatient Hospital Stay (HOSPITAL_BASED_OUTPATIENT_CLINIC_OR_DEPARTMENT_OTHER): Payer: Medicare Other | Admitting: Oncology

## 2019-12-16 ENCOUNTER — Other Ambulatory Visit: Payer: Self-pay

## 2019-12-16 ENCOUNTER — Inpatient Hospital Stay (HOSPITAL_BASED_OUTPATIENT_CLINIC_OR_DEPARTMENT_OTHER): Payer: Medicare Other | Admitting: Hospice and Palliative Medicine

## 2019-12-16 ENCOUNTER — Telehealth: Payer: Self-pay | Admitting: Nurse Practitioner

## 2019-12-16 ENCOUNTER — Inpatient Hospital Stay: Payer: Medicare Other

## 2019-12-16 ENCOUNTER — Other Ambulatory Visit: Payer: Medicare Other

## 2019-12-16 VITALS — BP 156/87 | HR 98 | Temp 97.3°F | Wt 207.2 lb

## 2019-12-16 DIAGNOSIS — C189 Malignant neoplasm of colon, unspecified: Secondary | ICD-10-CM

## 2019-12-16 DIAGNOSIS — G893 Neoplasm related pain (acute) (chronic): Secondary | ICD-10-CM | POA: Diagnosis not present

## 2019-12-16 DIAGNOSIS — R531 Weakness: Secondary | ICD-10-CM

## 2019-12-16 DIAGNOSIS — Z515 Encounter for palliative care: Secondary | ICD-10-CM | POA: Diagnosis not present

## 2019-12-16 LAB — COMPREHENSIVE METABOLIC PANEL
ALT: 56 U/L — ABNORMAL HIGH (ref 0–44)
AST: 73 U/L — ABNORMAL HIGH (ref 15–41)
Albumin: 2.8 g/dL — ABNORMAL LOW (ref 3.5–5.0)
Alkaline Phosphatase: 262 U/L — ABNORMAL HIGH (ref 38–126)
Anion gap: 14 (ref 5–15)
BUN: 18 mg/dL (ref 8–23)
CO2: 25 mmol/L (ref 22–32)
Calcium: 8.9 mg/dL (ref 8.9–10.3)
Chloride: 96 mmol/L — ABNORMAL LOW (ref 98–111)
Creatinine, Ser: 0.81 mg/dL (ref 0.44–1.00)
GFR calc Af Amer: >60 mL/min (ref >60)
GFR calc non Af Amer: >60 mL/min (ref >60)
Glucose, Bld: 252 mg/dL — ABNORMAL HIGH (ref 70–99)
Potassium: 3.8 mmol/L (ref 3.5–5.1)
Sodium: 135 mmol/L (ref 135–145)
Total Bilirubin: 1.4 mg/dL — ABNORMAL HIGH (ref 0.3–1.2)
Total Protein: 6.9 g/dL (ref 6.5–8.1)

## 2019-12-16 LAB — CBC WITH DIFFERENTIAL/PLATELET
Abs Immature Granulocytes: 0.05 10*3/uL (ref 0.00–0.07)
Basophils Absolute: 0 10*3/uL (ref 0.0–0.1)
Basophils Relative: 0 %
Eosinophils Absolute: 0 10*3/uL (ref 0.0–0.5)
Eosinophils Relative: 0 %
HCT: 37.8 % (ref 36.0–46.0)
Hemoglobin: 11.4 g/dL — ABNORMAL LOW (ref 12.0–15.0)
Immature Granulocytes: 0 %
Lymphocytes Relative: 9 %
Lymphs Abs: 1.3 10*3/uL (ref 0.7–4.0)
MCH: 25.6 pg — ABNORMAL LOW (ref 26.0–34.0)
MCHC: 30.2 g/dL (ref 30.0–36.0)
MCV: 84.8 fL (ref 80.0–100.0)
Monocytes Absolute: 1.3 10*3/uL — ABNORMAL HIGH (ref 0.1–1.0)
Monocytes Relative: 9 %
Neutro Abs: 12 10*3/uL — ABNORMAL HIGH (ref 1.7–7.7)
Neutrophils Relative %: 82 %
Platelets: 235 10*3/uL (ref 150–400)
RBC: 4.46 MIL/uL (ref 3.87–5.11)
RDW: 15.3 % (ref 11.5–15.5)
WBC: 14.7 10*3/uL — ABNORMAL HIGH (ref 4.0–10.5)
nRBC: 0 % (ref 0.0–0.2)

## 2019-12-16 MED ORDER — FENTANYL 12 MCG/HR TD PT72: 1.0000 | MEDICATED_PATCH | TRANSDERMAL | 0 refills | Status: AC

## 2019-12-16 MED ORDER — DEXAMETHASONE 4 MG PO TABS: 4.0000 mg | ORAL_TABLET | Freq: Every day | ORAL | 0 refills | Status: AC

## 2019-12-16 MED ORDER — HYDROCODONE-ACETAMINOPHEN 5-325 MG PO TABS: 1.0000 | ORAL_TABLET | ORAL | 0 refills | Status: DC | PRN

## 2019-12-16 NOTE — Telephone Encounter (Signed)
Called patient to schedule Palliative Consult, no answer - left message with reason for call along with my name and contact number. 

## 2019-12-16 NOTE — Progress Notes (Signed)
Tumor Board Documentation  Suzanne Colon was presented by Dr Grayland Ormond at our Tumor Board on 12/16/2019, which included representatives from medical oncology, radiation oncology, navigation, pathology, radiology, surgical, surgical oncology, internal medicine, pharmacy, genetics, pulmonology, palliative care, research.  Suzanne Colon currently presents as a new patient, for [place], for new positive pathology with history of the following treatments: surgical intervention(s), active survellience.  Additionally, we reviewed previous medical and familial history, history of present illness, and recent lab results along with all available histopathologic and imaging studies. The tumor board considered available treatment options and made the following recommendations: Palliative chemotherapy, Additional screening Send Pathology for futhre testing  The following procedures/referrals were also placed: No orders of the defined types were placed in this encounter.   Clinical Trial Status: not discussed   Staging used: AJCC Stage Group  AJCC Staging: T: X N: X M: 1B Group: Stage 4 B Adenocarcinoma of Colon   National site-specific guidelines NCCN were discussed with respect to the case.  Tumor board is a meeting of clinicians from various specialty areas who evaluate and discuss patients for whom a multidisciplinary approach is being considered. Final determinations in the plan of care are those of the provider(s). The responsibility for follow up of recommendations given during tumor board is that of the provider.   Today's extended care, comprehensive team conference, Suzanne Colon was not present for the discussion and was not examined.   Multidisciplinary Tumor Board is a multidisciplinary case peer review process.  Decisions discussed in the Multidisciplinary Tumor Board reflect the opinions of the specialists present at the conference without having examined the patient.  Ultimately, treatment and  diagnostic decisions rest with the primary provider(s) and the patient.
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
## 2019-12-16 NOTE — Progress Notes (Signed)
Nutrition Assessment   Reason for Assessment:  Referral from Dr. Grayland Ormond   ASSESSMENT:  79 year old female with stage IV adenocarcinoma of colon with mets to liver and lung.  Past medical history of HTN, prediabetes.    Met with patient and daughter, Otila Kluver in clinic this am.  Patient reports appetite is much better after nausea is better controlled and taking steroids. Prior to poor appetite and not eating much at all. Patient does not like oral nutrition supplements and reports will not drink them.  Has been trying to stay hydrated with ice cold water (new cup purchased).      Medications: amaryl, zofran, vit B12   Labs: Na 133, glucose 151   Anthropometrics:   Height: 64 inches Weight: 207 lb increased from 200 lb  UBW: 245 lb per patient about 1 month ago BMI: 35  16% weight loss in 1 month, significant   Estimated Energy Needs  Kcals: 2000-2350 Protein: 100-118 g Fluid: > 2 L   NUTRITION DIAGNOSIS: Inadequate oral intake related to altered GI function (nausea) as evidenced by 16% weight loss in 1 month, significant   INTERVENTION:  Discussed importance of good nutrition during treatment. Encouraged good sources of protein and examples of high protein foods discussed.  Discussed strategies to help with nausea if occurs again.  Handout provided to patient.  Offered samples of oral nutrition supplements and declined.  Contact information provided   MONITORING, EVALUATION, GOAL: Patient will consume adequate calories and protein to maintain lean muscle mass during treatment   Next Visit: March 18th, phone f/u  Amalio Loe B. Zenia Resides, [place], [place] Registered Dietitian [phone] (pager)

## 2019-12-17 ENCOUNTER — Telehealth: Payer: Self-pay | Admitting: *Deleted

## 2019-12-17 ENCOUNTER — Telehealth: Payer: Self-pay | Admitting: Nurse Practitioner

## 2019-12-17 NOTE — Telephone Encounter (Signed)
Verbal approval provided

## 2019-12-17 NOTE — Telephone Encounter (Signed)
Lenell Antu with [facility] called asking for order approval for PT 2 week 4, 1 week 4 and home health aide 2 week 4 for this patient. Please return her call [phone]

## 2019-12-17 NOTE — Progress Notes (Signed)
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  INTERVAL HISTORY: Patient returns to clinic today for further evaluation and treatment planning.  Her pain is much better controlled.  Since initiating dexamethasone her appetite has significantly improved along with her nausea. Her performance status is improving. She continues to have insomnia. She had no neurologic complaints.  She denies any recent fevers or illnesses.  She has no chest pain, shortness of breath, cough, or hemoptysis.  She has small frequent bowel movements, but does not endorse diarrhea.  She has no urinary complaints. Patient offers no further specific complaints today.  REVIEW OF SYSTEMS:   Review of Systems  Constitutional: Positive for malaise/fatigue. Negative for fever and weight loss.  Respiratory: Negative.  Negative for cough, hemoptysis and shortness of breath.   Cardiovascular: Negative.  Negative for chest pain and leg swelling.  Gastrointestinal: Positive for nausea. Negative for abdominal pain, blood in stool, constipation, diarrhea, melena and vomiting.  Genitourinary: Negative.  Negative for dysuria and hematuria.  Musculoskeletal: Negative.  Negative for back pain.  Skin: Negative.  Negative for rash.  Neurological: Positive for weakness. Negative for dizziness, focal weakness and headaches.  Psychiatric/Behavioral: The patient has insomnia. The patient is not nervous/anxious.     As per HPI. Otherwise, a complete review of systems is negative.  PAST MEDICAL HISTORY: Past Medical History:  Diagnosis Date  . Depression   . Hypertension   . Pre-diabetes     PAST SURGICAL HISTORY: Past Surgical History:  Procedure Laterality Date  .  BREAST BIOPSY N/A 30 + years   neg    FAMILY HISTORY: No family history on file.  ADVANCED DIRECTIVES (Y/N):  N  HEALTH MAINTENANCE: Social History   Tobacco Use  . Smoking status: Former Research scientist (life sciences)  . Smokeless tobacco: Never Used  . Tobacco comment: 1990's  Substance Use Topics  . Alcohol use: Not Currently  . Drug use: Not Currently     Colonoscopy:  PAP:  Bone density:  Lipid panel:  Allergies  Allergen Reactions  . Atorvastatin Other (See Comments)    Flu like symptoms  . Clarithromycin Other (See Comments)  . Codeine Rash  . Sulfa Antibiotics Rash    Current Outpatient Medications  Medication Sig Dispense Refill  . amLODipine (NORVASC) 10 MG tablet TAKE 1 TABLET DAILY    . atenolol (TENORMIN) 50 MG tablet TAKE 1 TABLET DAILY    . buPROPion (WELLBUTRIN) 75 MG tablet TAKE 1 TABLET DAILY    . hydrocortisone 2.5 % cream APPLY TO AFFECTED AREA TWICE A DAY FOR 7 DAYS    . ondansetron (ZOFRAN) 8 MG tablet Take 1 tablet (8 mg total) by mouth every 8 (eight) hours as needed for nausea or vomiting. 60 tablet 1  . sertraline (ZOLOFT) 100 MG tablet Take by mouth.    Marland Kitchen aspirin 81 MG EC tablet Take by mouth.    . Biotin 5 MG CAPS Take by mouth.    . dexamethasone (DECADRON) 4 MG tablet Take 1 tablet (4 mg total) by mouth daily. 15 tablet 0  . fentaNYL (DURAGESIC) 12 MCG/HR Place 1 patch onto the skin every 3 (three) days. 10 patch 0  . HYDROcodone-acetaminophen (NORCO) 5-325 MG tablet Take 1 tablet by mouth every 4 (four)  hours as needed for moderate pain. 60 tablet 0  . hydrocortisone (ANUSOL-HC) 25 MG suppository Place 25 mg rectally 2 (two) times daily.    Marland Kitchen ketoconazole (NIZORAL) 2 % cream Apply topically.    . vitamin B-12 (CYANOCOBALAMIN) 100 MCG tablet Take by mouth.     No current facility-administered medications for this visit.    OBJECTIVE: Vitals:   12/16/19 0928  BP: (!) 156/87  Pulse: 98  Temp: (!) 97.3 F (36.3 C)     Body mass index is 35.57 kg/m.     ECOG FS:2 - Symptomatic, <50% confined to bed  General: Well-developed, well-nourished, no acute distress. Eyes: Pink conjunctiva, anicteric sclera. HEENT: Normocephalic, moist mucous membranes. Lungs: No audible wheezing or coughing. Heart: Regular rate and rhythm. Abdomen: Soft, nontender, no obvious distention. Musculoskeletal: No edema, cyanosis, or clubbing. Neuro: Alert, answering all questions appropriately. Cranial nerves grossly intact. Skin: No rashes or petechiae noted. Psych: Normal affect.  LAB RESULTS:  Lab Results  Component Value Date   NA 135 12/16/2019   K 3.8 12/16/2019   CL 96 (L) 12/16/2019   CO2 25 12/16/2019   GLUCOSE 252 (H) 12/16/2019   BUN 18 12/16/2019   CREATININE 0.81 12/16/2019   CALCIUM 8.9 12/16/2019   PROT 6.9 12/16/2019   ALBUMIN 2.8 (L) 12/16/2019   AST 73 (H) 12/16/2019   ALT 56 (H) 12/16/2019   ALKPHOS 262 (H) 12/16/2019   BILITOT 1.4 (H) 12/16/2019   GFRNONAA >60 12/16/2019   GFRAA >60 12/16/2019    Lab Results  Component Value Date   WBC 14.7 (H) 12/16/2019   NEUTROABS 12.0 (H) 12/16/2019   HGB 11.4 (L) 12/16/2019   HCT 37.8 12/16/2019   MCV 84.8 12/16/2019   PLT 235 12/16/2019     STUDIES: US BIOPSY (LIVER)  Result Date: 12/06/2019 INDICATION: Weight loss, nausea, multiple confluent masses throughout the liver parenchyma and bilateral pulmonary nodules. The patient presents for liver lesion BIOPSY. EXAM: ULTRASOUND GUIDED CORE BIOPSY OF LIVER MEDICATIONS: None. ANESTHESIA/SEDATION: Fentanyl 2.0 mcg IV; Versed 100 mg IV Moderate Sedation Time:  21 minutes. The patient was continuously monitored during the procedure by the interventional radiology nurse under my direct supervision. PROCEDURE: The procedure, risks, benefits, and alternatives were explained to the patient. Questions regarding the procedure were encouraged and answered. The patient understands and consents to the procedure. A time-out was performed prior to  initiating the procedure. Ultrasound was performed of the liver. The right abdominal wall was prepped with chlorhexidine in a sterile fashion, and a sterile drape was applied covering the operative field. A sterile gown and sterile gloves were used for the procedure. Local anesthesia was provided with 1% Lidocaine. Under ultrasound guidance, a 17 gauge trocar needle was advanced into the inferior aspect of the right lobe of the liver. After confirming needle tip position, coaxial 18 gauge core biopsy samples were obtained. Three total core biopsy samples were submitted in formalin. Additional post biopsy ultrasound was performed after outer needle removal. COMPLICATIONS: None immediate. FINDINGS: The liver is filled with ill-defined confluent relatively hyperechoic mass lesions that blend into one another and are difficult to discretely measure. Area of abnormal tissue in the inferior right lobe was targeted with solid tissue obtained. IMPRESSION: Ultrasound-guided core biopsy performed lesion within the inferior right lobe parenchyma. Multiple ill-defined confluent masses blend into one another by ultrasound and are relatively hyperechoic. Electronically Signed   By: Aletta Edouard M.D.   On: 12/06/2019 11:18   CT ABDOMEN W  WO CONTRAST  Result Date: 11/19/2019 CLINICAL DATA:  Liver masses, follow-up chest CT worsening nausea and unexplained 40 pound weight loss EXAM: CT ABDOMEN WITHOUT AND WITH CONTRAST TECHNIQUE: Multidetector CT imaging of the abdomen was performed following the standard protocol before and following the bolus administration of intravenous contrast. CONTRAST:  114m OMNIPAQUE IOHEXOL 300 MG/ML  SOLN COMPARISON:  None. FINDINGS: Lower chest: Multiple pulmonary nodules. Also seen on recent chest CT. Hepatobiliary: Numerous hepatic lesions infiltrating the liver showing hypovascular characteristics. All hepatic subsegment are involved. (Image 66, series 4) 5.0 x 3.6 cm lesion in the central  right hepatic lobe adjacent the porta hepatis. (Image 40, series 4) 5.0 x 3.1 cm lesion in the peripheral right hepatic lobe along the margin of hepatic subsegment VIII. Numerous lesions also present in the left hepatic lobe. Largest lesion in the liver is in the inferior right hepatic lobe on series 4, image 90 measuring 6.3 x 4.8 cm. Portal vein is patent. Gallstone in the gallbladder fundus, large with gallbladder contracted around the gallstone and mild gallbladder wall thickening circumferentially. No signs of pericholecystic stranding or biliary ductal dilation. Pancreas: Pancreas is normal. Spleen: Spleen normal without focal lesion. Adrenals/Urinary Tract: CT appearance of adrenal glands and kidneys is normal no hydronephrosis, symmetric renal enhancement. Stomach/Bowel: Is no signs of acute gastrointestinal process. Pelvic bowel loops are not imaged. Signs of colonic diverticulosis. Vascular/Lymphatic: Calcified atherosclerotic plaque throughout the abdominal aorta. No signs of aneurysm. No signs of adenopathy in the retroperitoneum or upper abdomen. Other: No signs of ascites. Musculoskeletal: Spinal degenerative change. No acute or destructive bone process. IMPRESSION: 1. Numerous hepatic lesions infiltrating the majority of the liver, compatible with metastatic disease. 2. Multiple pulmonary nodules also suspicious for metastatic disease as seen on recent CT chest. 3. Cholelithiasis with large gallstone in the gallbladder fundus. 4. Colonic diverticulosis. 5. Aortic atherosclerosis. Aortic Atherosclerosis (ICD10-I70.0). Electronically Signed   By: GZetta BillsM.D.   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  PLAN:    1. 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  Ultrasound-guided liver biopsy on December 06, 2019 confirmed adenocarcinoma likely of colon origin.  Will order K-ras/EGFR mutation. Patient's performance status is improving and she wishes to pursue palliative chemotherapy using FOLFOX plus a possible biologic agent depending on her mutational status. Patient will have chemo class patient's performance status is rapidly declining, but she did express some interest in pursuing palliative chemotherapy. Patient will undergo chemo class and then port placement next week. She will follow up 1 to 2 days after her port placement and if her performance status continues to improve will further discuss treatment planning at that time. Appreciate palliative care input. 2.  Nausea: Improved continue Zofran and dexamethasone as prescribed. 3.  Poor appetite/weight loss: Improved. Continue dexamethasone. Appreciate dietary input. 4.  Insomnia: Slightly improved. 5.  Frequent bowel movements: The patient has not had a colonoscopy, it is likely she has a colon mass disrupting her normal motility.  Recommended daily stool softeners and continue to monitor closely.   6.  Abdominal pain: Improved. Continue Vicodin as needed.   Patient expressed understanding and was in agreement with this plan. She also understands that She can call clinic at any time with any questions, concerns, or complaints.  Cancer Staging Adenocarcinoma of colon [facility]) Staging form: Colon and Rectum, AJCC 8th Edition - Clinical stage from 12/10/2019: Stage IVB (cTX, cNX, pM1b) - Signed by Lloyd Huger, MD on 12/10/2019   Lloyd Huger, MD   12/17/2019 12:29 PM

## 2019-12-17 NOTE — Telephone Encounter (Signed)
Returned call to patient's son, Karliah Svitak, due to patient was having phone issues.  Spoke with son and he put me on speaker phone so that patient and his sister could hear.  After discussing Palliative services with them and all questions were answered, patient was in agreement with starting Palliative services.  I have scheduled a Telephone Consult for 01/04/20 @ 10:30 AM.  Son requested something sooner if possible.  I will reach out to NP to see if we can work her in sooner.

## 2019-12-19 ENCOUNTER — Encounter: Payer: Self-pay | Admitting: Oncology

## 2019-12-20 ENCOUNTER — Telehealth: Payer: Self-pay

## 2019-12-20 MED ORDER — PROMETHAZINE HCL 25 MG PO TABS: 25.0000 mg | ORAL_TABLET | Freq: Four times a day (QID) | ORAL | 0 refills | Status: AC | PRN

## 2019-12-20 NOTE — Telephone Encounter (Signed)
Pathology request form for additional testing on recent liver bx faxed.

## 2019-12-20 NOTE — Patient Instructions (Signed)
Oxaliplatin Injection What is this medicine? OXALIPLATIN (ox AL i PLA tin) is a chemotherapy drug. It targets fast dividing cells, like cancer cells, and causes these cells to die. This medicine is used to treat cancers of the colon and rectum, and many other cancers. This medicine may be used for other purposes; ask your health care provider or pharmacist if you have questions. COMMON BRAND NAME(S): Eloxatin What should I tell my health care provider before I take this medicine? They need to know if you have any of these conditions:  heart disease  history of irregular heartbeat  liver disease  low blood counts, like white cells, platelets, or red blood cells  lung or breathing disease, like asthma  take medicines that treat or prevent blood clots  tingling of the fingers or toes, or other nerve disorder  an unusual or allergic reaction to oxaliplatin, other chemotherapy, other medicines, foods, dyes, or preservatives  pregnant or trying to get pregnant  breast-feeding How should I use this medicine? This drug is given as an infusion into a vein. It is administered in a hospital or clinic by a specially trained health care professional. Talk to your pediatrician regarding the use of this medicine in children. Special care may be needed. Overdosage: If you think you have taken too much of this medicine contact a poison control center or emergency room at once. NOTE: This medicine is only for you. Do not share this medicine with others. What if I miss a dose? It is important not to miss a dose. Call your doctor or health care professional if you are unable to keep an appointment. What may interact with this medicine? Do not take this medicine with any of the following medications:  cisapride  dronedarone  pimozide  thioridazine This medicine may also interact with the following medications:  aspirin and aspirin-like medicines  certain medicines that treat or prevent  blood clots like warfarin, apixaban, dabigatran, and rivaroxaban  cisplatin  cyclosporine  diuretics  medicines for infection like acyclovir, adefovir, amphotericin B, bacitracin, cidofovir, foscarnet, ganciclovir, gentamicin, pentamidine, vancomycin  NSAIDs, medicines for pain and inflammation, like ibuprofen or naproxen  other medicines that prolong the QT interval (an abnormal heart rhythm)  pamidronate  zoledronic acid This list may not describe all possible interactions. Give your health care provider a list of all the medicines, herbs, non-prescription drugs, or dietary supplements you use. Also tell them if you smoke, drink alcohol, or use illegal drugs. Some items may interact with your medicine. What should I watch for while using this medicine? Your condition will be monitored carefully while you are receiving this medicine. You may need blood work done while you are taking this medicine. This medicine may make you feel generally unwell. This is not uncommon as chemotherapy can affect healthy cells as well as cancer cells. Report any side effects. Continue your course of treatment even though you feel ill unless your healthcare professional tells you to stop. This medicine can make you more sensitive to cold. Do not drink cold drinks or use ice. Cover exposed skin before coming in contact with cold temperatures or cold objects. When out in cold weather wear warm clothing and cover your mouth and nose to warm the air that goes into your lungs. Tell your doctor if you get sensitive to the cold. Do not become pregnant while taking this medicine or for 9 months after stopping it. Women should inform their health care professional if they wish to become   pregnant or think they might be pregnant. Men should not father a child while taking this medicine and for 6 months after stopping it. There is potential for serious side effects to an unborn child. Talk to your health care professional  for more information. Do not breast-feed a child while taking this medicine or for 3 months after stopping it. This medicine has caused ovarian failure in some women. This medicine may make it more difficult to get pregnant. Talk to your health care professional if you are concerned about your fertility. This medicine has caused decreased sperm counts in some men. This may make it more difficult to father a child. Talk to your health care professional if you are concerned about your fertility. This medicine may increase your risk of getting an infection. Call your health care professional for advice if you get a fever, chills, or sore throat, or other symptoms of a cold or flu. Do not treat yourself. Try to avoid being around people who are sick. Avoid taking medicines that contain aspirin, acetaminophen, ibuprofen, naproxen, or ketoprofen unless instructed by your health care professional. These medicines may hide a fever. Be careful brushing or flossing your teeth or using a toothpick because you may get an infection or bleed more easily. If you have any dental work done, tell your dentist you are receiving this medicine. What side effects may I notice from receiving this medicine? Side effects that you should report to your doctor or health care professional as soon as possible:  allergic reactions like skin rash, itching or hives, swelling of the face, lips, or tongue  breathing problems  cough  low blood counts - this medicine may decrease the number of white blood cells, red blood cells, and platelets. You may be at increased risk for infections and bleeding  nausea, vomiting  pain, redness, or irritation at site where injected  pain, tingling, numbness in the hands or feet  signs and symptoms of bleeding such as bloody or black, tarry stools; red or dark brown urine; spitting up blood or brown material that looks like coffee grounds; red spots on the skin; unusual bruising or bleeding  from the eyes, gums, or nose  signs and symptoms of a dangerous change in heartbeat or heart rhythm like chest pain; dizziness; fast, irregular heartbeat; palpitations; feeling faint or lightheaded; falls  signs and symptoms of infection like fever; chills; cough; sore throat; pain or trouble passing urine  signs and symptoms of liver injury like dark yellow or brown urine; general ill feeling or flu-like symptoms; light-colored stools; loss of appetite; nausea; right upper belly pain; unusually weak or tired; yellowing of the eyes or skin  signs and symptoms of low red blood cells or anemia such as unusually weak or tired; feeling faint or lightheaded; falls  signs and symptoms of muscle injury like dark urine; trouble passing urine or change in the amount of urine; unusually weak or tired; muscle pain; back pain Side effects that usually do not require medical attention (report to your doctor or health care professional if they continue or are bothersome):  changes in taste  diarrhea  gas  hair loss  loss of appetite  mouth sores This list may not describe all possible side effects. Call your doctor for medical advice about side effects. You may report side effects to FDA at 1-800-FDA-1088. Where should I keep my medicine? This drug is given in a hospital or clinic and will not be stored at home. NOTE:   This sheet is a summary. It may not cover all possible information. If you have questions about this medicine, talk to your doctor, pharmacist, or health care provider.  2020 Elsevier/Gold Standard (2019-02-24 12:20:35) Fluorouracil, 5-FU injection What is this medicine? FLUOROURACIL, 5-FU (flure oh YOOR a sil) is a chemotherapy drug. It slows the growth of cancer cells. This medicine is used to treat many types of cancer like breast cancer, colon or rectal cancer, pancreatic cancer, and stomach cancer. This medicine may be used for other purposes; ask your health care provider or  pharmacist if you have questions. COMMON BRAND NAME(S): Adrucil What should I tell my health care provider before I take this medicine? They need to know if you have any of these conditions:  blood disorders  dihydropyrimidine dehydrogenase (DPD) deficiency  infection (especially a virus infection such as chickenpox, cold sores, or herpes)  kidney disease  liver disease  malnourished, poor nutrition  recent or ongoing radiation therapy  an unusual or allergic reaction to fluorouracil, other chemotherapy, other medicines, foods, dyes, or preservatives  pregnant or trying to get pregnant  breast-feeding How should I use this medicine? This drug is given as an infusion or injection into a vein. It is administered in a hospital or clinic by a specially trained health care professional. Talk to your pediatrician regarding the use of this medicine in children. Special care may be needed. Overdosage: If you think you have taken too much of this medicine contact a poison control center or emergency room at once. NOTE: This medicine is only for you. Do not share this medicine with others. What if I miss a dose? It is important not to miss your dose. Call your doctor or health care professional if you are unable to keep an appointment. What may interact with this medicine?  allopurinol  cimetidine  dapsone  digoxin  hydroxyurea  leucovorin  levamisole  medicines for seizures like ethotoin, fosphenytoin, phenytoin  medicines to increase blood counts like filgrastim, pegfilgrastim, sargramostim  medicines that treat or prevent blood clots like warfarin, enoxaparin, and dalteparin  methotrexate  metronidazole  pyrimethamine  some other chemotherapy drugs like busulfan, cisplatin, estramustine, vinblastine  trimethoprim  trimetrexate  vaccines Talk to your doctor or health care professional before taking any of these  medicines:  acetaminophen  aspirin  ibuprofen  ketoprofen  naproxen This list may not describe all possible interactions. Give your health care provider a list of all the medicines, herbs, non-prescription drugs, or dietary supplements you use. Also tell them if you smoke, drink alcohol, or use illegal drugs. Some items may interact with your medicine. What should I watch for while using this medicine? Visit your doctor for checks on your progress. This drug may make you feel generally unwell. This is not uncommon, as chemotherapy can affect healthy cells as well as cancer cells. Report any side effects. Continue your course of treatment even though you feel ill unless your doctor tells you to stop. In some cases, you may be given additional medicines to help with side effects. Follow all directions for their use. Call your doctor or health care professional for advice if you get a fever, chills or sore throat, or other symptoms of a cold or flu. Do not treat yourself. This drug decreases your body's ability to fight infections. Try to avoid being around people who are sick. This medicine may increase your risk to bruise or bleed. Call your doctor or health care professional if you notice any   unusual bleeding. Be careful brushing and flossing your teeth or using a toothpick because you may get an infection or bleed more easily. If you have any dental work done, tell your dentist you are receiving this medicine. Avoid taking products that contain aspirin, acetaminophen, ibuprofen, naproxen, or ketoprofen unless instructed by your doctor. These medicines may hide a fever. Do not become pregnant while taking this medicine. Women should inform their doctor if they wish to become pregnant or think they might be pregnant. There is a potential for serious side effects to an unborn child. Talk to your health care professional or pharmacist for more information. Do not breast-feed an infant while taking  this medicine. Men should inform their doctor if they wish to father a child. This medicine may lower sperm counts. Do not treat diarrhea with over the counter products. Contact your doctor if you have diarrhea that lasts more than 2 days or if it is severe and watery. This medicine can make you more sensitive to the sun. Keep out of the sun. If you cannot avoid being in the sun, wear protective clothing and use sunscreen. Do not use sun lamps or tanning beds/booths. What side effects may I notice from receiving this medicine? Side effects that you should report to your doctor or health care professional as soon as possible:  allergic reactions like skin rash, itching or hives, swelling of the face, lips, or tongue  low blood counts - this medicine may decrease the number of white blood cells, red blood cells and platelets. You may be at increased risk for infections and bleeding.  signs of infection - fever or chills, cough, sore throat, pain or difficulty passing urine  signs of decreased platelets or bleeding - bruising, pinpoint red spots on the skin, black, tarry stools, blood in the urine  signs of decreased red blood cells - unusually weak or tired, fainting spells, lightheadedness  breathing problems  changes in vision  chest pain  mouth sores  nausea and vomiting  pain, swelling, redness at site where injected  pain, tingling, numbness in the hands or feet  redness, swelling, or sores on hands or feet  stomach pain  unusual bleeding Side effects that usually do not require medical attention (report to your doctor or health care professional if they continue or are bothersome):  changes in finger or toe nails  diarrhea  dry or itchy skin  hair loss  headache  loss of appetite  sensitivity of eyes to the light  stomach upset  unusually teary eyes This list may not describe all possible side effects. Call your doctor for medical advice about side effects.  You may report side effects to FDA at 1-800-FDA-1088. Where should I keep my medicine? This drug is given in a hospital or clinic and will not be stored at home. NOTE: This sheet is a summary. It may not cover all possible information. If you have questions about this medicine, talk to your doctor, pharmacist, or health care provider.  2020 Elsevier/Gold Standard (2008-02-10 13:53:16) Leucovorin injection What is this medicine? LEUCOVORIN (loo koe VOR in) is used to prevent or treat the harmful effects of some medicines. This medicine is used to treat anemia caused by a low amount of folic acid in the body. It is also used with 5-fluorouracil (5-FU) to treat colon cancer. This medicine may be used for other purposes; ask your health care provider or pharmacist if you have questions. What should I tell my health care   provider before I take this medicine? They need to know if you have any of these conditions:  anemia from low levels of vitamin B-12 in the blood  an unusual or allergic reaction to leucovorin, folic acid, other medicines, foods, dyes, or preservatives  pregnant or trying to get pregnant  breast-feeding How should I use this medicine? This medicine is for injection into a muscle or into a vein. It is given by a health care professional in a hospital or clinic setting. Talk to your pediatrician regarding the use of this medicine in children. Special care may be needed. Overdosage: If you think you have taken too much of this medicine contact a poison control center or emergency room at once. NOTE: This medicine is only for you. Do not share this medicine with others. What if I miss a dose? This does not apply. What may interact with this medicine?  capecitabine  fluorouracil  phenobarbital  phenytoin  primidone  trimethoprim-sulfamethoxazole This list may not describe all possible interactions. Give your health care provider a list of all the medicines, herbs,  non-prescription drugs, or dietary supplements you use. Also tell them if you smoke, drink alcohol, or use illegal drugs. Some items may interact with your medicine. What should I watch for while using this medicine? Your condition will be monitored carefully while you are receiving this medicine. This medicine may increase the side effects of 5-fluorouracil, 5-FU. Tell your doctor or health care professional if you have diarrhea or mouth sores that do not get better or that get worse. What side effects may I notice from receiving this medicine? Side effects that you should report to your doctor or health care professional as soon as possible:  allergic reactions like skin rash, itching or hives, swelling of the face, lips, or tongue  breathing problems  fever, infection  mouth sores  unusual bleeding or bruising  unusually weak or tired Side effects that usually do not require medical attention (report to your doctor or health care professional if they continue or are bothersome):  constipation or diarrhea  loss of appetite  nausea, vomiting This list may not describe all possible side effects. Call your doctor for medical advice about side effects. You may report side effects to FDA at 1-800-FDA-1088. Where should I keep my medicine? This drug is given in a hospital or clinic and will not be stored at home. NOTE: This sheet is a summary. It may not cover all possible information. If you have questions about this medicine, talk to your doctor, pharmacist, or health care provider.  2020 Elsevier/Gold Standard (2008-04-12 16:50:29)  

## 2019-12-21 ENCOUNTER — Inpatient Hospital Stay (HOSPITAL_BASED_OUTPATIENT_CLINIC_OR_DEPARTMENT_OTHER): Payer: Medicare Other | Admitting: Oncology

## 2019-12-21 ENCOUNTER — Inpatient Hospital Stay: Payer: Medicare Other | Attending: Oncology

## 2019-12-21 ENCOUNTER — Other Ambulatory Visit: Payer: Self-pay

## 2019-12-21 DIAGNOSIS — G4700 Insomnia, unspecified: Secondary | ICD-10-CM | POA: Insufficient documentation

## 2019-12-21 DIAGNOSIS — C189 Malignant neoplasm of colon, unspecified: Secondary | ICD-10-CM

## 2019-12-21 DIAGNOSIS — C787 Secondary malignant neoplasm of liver and intrahepatic bile duct: Secondary | ICD-10-CM | POA: Insufficient documentation

## 2019-12-21 DIAGNOSIS — R110 Nausea: Secondary | ICD-10-CM | POA: Insufficient documentation

## 2019-12-21 DIAGNOSIS — Z7952 Long term (current) use of systemic steroids: Secondary | ICD-10-CM | POA: Insufficient documentation

## 2019-12-21 DIAGNOSIS — C7800 Secondary malignant neoplasm of unspecified lung: Secondary | ICD-10-CM | POA: Insufficient documentation

## 2019-12-21 NOTE — Research (Addendum)
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  Dr. Grayland Ormond recommends clinical trial for patient.  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 I reviewed study with patient and son including voluntary participation, purpose of study, and risks and benefits. I also explained that [employer] is seeking participants to give 5 vials of whole blood in order to participate. [employer] does compensate patients with a gift card. The blood is used by [employer] to further explore biomarkers in newly diagnosed, untreated cancer patients. Patientdid nothaveany additional questions after study was explained. Patient was agreeable to consent and signed consent form that was IRB approved 08/08/2018. Copy of signed document given to patient.Patient and son also given a copy of my business card.  Patient will give blood at her first chemotherapy appointment on 003/05/2020.  Patient understands and is agreeable to have blood work collected.  Patient also allowed research associate to ask questions to complete [employer] history worksheet at this visit. Patient meets eligibility criteria and has no exclusion criteria as reviewed by Yolande Jolly, RN lead research nurse.   [facility] Research Associate 12/21/2019 11:53  Patient presented today for lab work and research labs were drawn at the same time by lab staff.  Patient gift card provided by study was given to her son Timmothy Sours at her request.  Blood samples to be shipped to [employer] today. I thanked patient for her participation.   [place] Associate 12/27/2019 09:54 AM

## 2019-12-21 NOTE — Progress Notes (Signed)
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 I introduced the chemo care clinic and we discussed that the role of the clinic is to assist those who are at an increased risk of emergency room visits and/or complications during the course of chemotherapy treatment. We discussed that the increased risk takes into account factors such as age, performance status, and co-morbidities. We also discussed that for some, this might include barriers to care such as not having a primary care provider, lack of insurance/transportation, or not being able to afford medications. We discussed that the goal of the program is to help prevent unplanned ER visits and help reduce complications during chemotherapy. We do this by discussing specific risk factors to each individual and identifying ways that we can help improve these risk factors and reduce barriers to care.   ECOG FS:2 - Symptomatic, <50% confined to bed  Review of systems- Review of Systems  Constitutional: Positive for malaise/fatigue. Negative for chills,  fever and weight loss.  HENT: Negative for congestion, ear pain and tinnitus.   Eyes: Negative.  Negative for blurred vision and double vision.  Respiratory: Negative.  Negative for cough, sputum production and shortness of breath.   Cardiovascular: Negative.  Negative for chest pain, palpitations and leg swelling.  Gastrointestinal: Positive for abdominal pain and nausea. Negative for constipation, diarrhea and vomiting.  Genitourinary: Negative for dysuria, frequency and urgency.  Musculoskeletal: Negative for back pain and falls.  Skin: Negative.  Negative for rash.  Neurological: Positive for weakness. Negative for headaches.  Endo/Heme/Allergies: Negative.  Does not bruise/bleed easily.  Psychiatric/Behavioral: Negative for depression. The patient is nervous/anxious. The patient does not have insomnia.     Current treatment- Palliative FOLFOX  Allergies  Allergen Reactions  . Atorvastatin Other (See Comments)    Flu like symptoms  . Clarithromycin Other (See Comments)  . Codeine Rash  . Sulfa Antibiotics Rash    Past Medical History:  Diagnosis Date  . Depression   . Hypertension   . Pre-diabetes     Past Surgical History:  Procedure Laterality Date  . BREAST BIOPSY N/A 30 + years   neg    Social History   Socioeconomic History  . Marital status: Married    Spouse name: Not on file  . Number of children: Not on file  . Years of education: Not on file  . Highest education level: Not on file  Occupational History  . Not on file  Tobacco Use  . Smoking status: Former Research scientist (life sciences)  . Smokeless tobacco: Never Used  . Tobacco comment: 1990's  Substance and Sexual Activity  . Alcohol use: Not Currently  .  Drug use: Not Currently  . Sexual activity: Not on file  Other Topics Concern  . Not on file  Social History Narrative  . Not on file   Social Determinants of Health   Financial Resource Strain:   . Difficulty of Paying Living Expenses: Not on file  Food  Insecurity:   . Worried About Charity fundraiser in the Last Year: Not on file  . Ran Out of Food in the Last Year: Not on file  Transportation Needs:   . Lack of Transportation (Medical): Not on file  . Lack of Transportation (Non-Medical): Not on file  Physical Activity:   . Days of Exercise per Week: Not on file  . Minutes of Exercise per Session: Not on file  Stress:   . Feeling of Stress : Not on file  Social Connections:   . Frequency of Communication with Friends and Family: Not on file  . Frequency of Social Gatherings with Friends and Family: Not on file  . Attends Religious Services: Not on file  . Active Member of Clubs or Organizations: Not on file  . Attends Archivist Meetings: Not on file  . Marital Status: Not on file  Intimate Partner Violence:   . Fear of Current or Ex-Partner: Not on file  . Emotionally Abused: Not on file  . Physically Abused: Not on file  . Sexually Abused: Not on file    No family history on file.   Current Outpatient Medications:  .  amLODipine (NORVASC) 10 MG tablet, TAKE 1 TABLET DAILY, Disp: , Rfl:  .  aspirin 81 MG EC tablet, Take by mouth., Disp: , Rfl:  .  atenolol (TENORMIN) 50 MG tablet, TAKE 1 TABLET DAILY, Disp: , Rfl:  .  Biotin 5 MG CAPS, Take by mouth., Disp: , Rfl:  .  buPROPion (WELLBUTRIN) 75 MG tablet, TAKE 1 TABLET DAILY, Disp: , Rfl:  .  dexamethasone (DECADRON) 4 MG tablet, Take 1 tablet (4 mg total) by mouth daily., Disp: 15 tablet, Rfl: 0 .  fentaNYL (DURAGESIC) 12 MCG/HR, Place 1 patch onto the skin every 3 (three) days., Disp: 10 patch, Rfl: 0 .  HYDROcodone-acetaminophen (NORCO) 5-325 MG tablet, Take 1 tablet by mouth every 4 (four) hours as needed for moderate pain., Disp: 60 tablet, Rfl: 0 .  hydrocortisone (ANUSOL-HC) 25 MG suppository, Place 25 mg rectally 2 (two) times daily., Disp: , Rfl:  .  hydrocortisone 2.5 % cream, APPLY TO AFFECTED AREA TWICE A DAY FOR 7 DAYS, Disp: , Rfl:  .  ketoconazole  (NIZORAL) 2 % cream, Apply topically., Disp: , Rfl:  .  ondansetron (ZOFRAN) 8 MG tablet, Take 1 tablet (8 mg total) by mouth every 8 (eight) hours as needed for nausea or vomiting., Disp: 60 tablet, Rfl: 1 .  promethazine (PHENERGAN) 25 MG tablet, Take 1 tablet (25 mg total) by mouth every 6 (six) hours as needed for nausea or vomiting., Disp: 60 tablet, Rfl: 0 .  sertraline (ZOLOFT) 100 MG tablet, Take by mouth., Disp: , Rfl:  .  vitamin B-12 (CYANOCOBALAMIN) 100 MCG tablet, Take by mouth., Disp: , Rfl:   Physical exam: There were no vitals filed for this visit. Physical Exam Constitutional:      Appearance: Normal appearance.  HENT:     Head: Normocephalic and atraumatic.  Eyes:     Pupils: Pupils are equal, round, and reactive to light.  Cardiovascular:     Rate and Rhythm: Normal rate and regular rhythm.  Heart sounds: Normal heart sounds. No murmur.  Pulmonary:     Effort: Pulmonary effort is normal.     Breath sounds: Normal breath sounds. No wheezing.  Abdominal:     General: Bowel sounds are normal. There is no distension.     Palpations: Abdomen is soft.     Tenderness: There is no abdominal tenderness.  Musculoskeletal:        General: Normal range of motion.     Cervical back: Normal range of motion.  Skin:    General: Skin is warm and dry.     Findings: No rash.  Neurological:     Mental Status: She is alert and oriented to person, place, and time.  Psychiatric:        Judgment: Judgment normal.      CMP Latest Ref Rng & Units 12/16/2019  Glucose 70 - 99 mg/dL 252(H)  BUN 8 - 23 mg/dL 18  Creatinine 0.44 - 1.00 mg/dL 0.81  Sodium 135 - 145 mmol/L 135  Potassium 3.5 - 5.1 mmol/L 3.8  Chloride 98 - 111 mmol/L 96(L)  CO2 22 - 32 mmol/L 25  Calcium 8.9 - 10.3 mg/dL 8.9  Total Protein 6.5 - 8.1 g/dL 6.9  Total Bilirubin 0.3 - 1.2 mg/dL 1.4(H)  Alkaline Phos 38 - 126 U/L 262(H)  AST 15 - 41 U/L 73(H)  ALT 0 - 44 U/L 56(H)   CBC Latest Ref Rng & Units  12/16/2019  WBC 4.0 - 10.5 K/uL 14.7(H)  Hemoglobin 12.0 - 15.0 g/dL 11.4(L)  Hematocrit 36.0 - 46.0 % 37.8  Platelets 150 - 400 K/uL 235    No images are attached to the encounter.  US BIOPSY (LIVER)  Result Date: 12/06/2019 INDICATION: Weight loss, nausea, multiple confluent masses throughout the liver parenchyma and bilateral pulmonary nodules. The patient presents for liver lesion BIOPSY. EXAM: ULTRASOUND GUIDED CORE BIOPSY OF LIVER MEDICATIONS: None. ANESTHESIA/SEDATION: Fentanyl 2.0 mcg IV; Versed 100 mg IV Moderate Sedation Time:  21 minutes. The patient was continuously monitored during the procedure by the interventional radiology nurse under my direct supervision. PROCEDURE: The procedure, risks, benefits, and alternatives were explained to the patient. Questions regarding the procedure were encouraged and answered. The patient understands and consents to the procedure. A time-out was performed prior to initiating the procedure. Ultrasound was performed of the liver. The right abdominal wall was prepped with chlorhexidine in a sterile fashion, and a sterile drape was applied covering the operative field. A sterile gown and sterile gloves were used for the procedure. Local anesthesia was provided with 1% Lidocaine. Under ultrasound guidance, a 17 gauge trocar needle was advanced into the inferior aspect of the right lobe of the liver. After confirming needle tip position, coaxial 18 gauge core biopsy samples were obtained. Three total core biopsy samples were submitted in formalin. Additional post biopsy ultrasound was performed after outer needle removal. COMPLICATIONS: None immediate. FINDINGS: The liver is filled with ill-defined confluent relatively hyperechoic mass lesions that blend into one another and are difficult to discretely measure. Area of abnormal tissue in the inferior right lobe was targeted with solid tissue obtained. IMPRESSION: Ultrasound-guided core biopsy performed lesion  within the inferior right lobe parenchyma. Multiple ill-defined confluent masses blend into one another by ultrasound and are relatively hyperechoic. Electronically Signed   By: Aletta Edouard M.D.   On: 12/06/2019 11:18     Assessment and plan- Patient is a 79 y.o. female who presents to [facility] for initial meeting in  preparation for starting chemotherapy for the treatment of    1. HPI: Ms. Laelani Vasko is a 79 year old female with multiple medical problems including stage IV adenocarcinoma of the colon metastatic to liver and lung.  She initially presented with profound weight loss and declining performance status. Some improvement after initiation of steroids.  She is currently pending initiation of chemo.  She has been seen by palliative care who addressed her goals and is offered support to help with her rapidly declining performance status.  He initiated home OT/PT and possible home nursing care along with social work.  Unfortunately, patient lives at home alone in an unsafe environment.  Her son and daughter are visiting from out of town and are trying to help corrdinate her care.  She has been having significant abdominal and chest pain secondary to her diagnosis and has recently been prescribed transdermal fentanyl 12 mcg every 72 hours and short acting Norco 5/325 mg every 4-6 for breakthrough pain.  She was started on a prophylactic bowel regimen.  She is scheduled to have her port placed on Friday of this week and return to clinic next Monday to discuss initiation of palliative treatment.  2. Chemo Care Clinic/High Risk for ER/Hospitalization during chemotherapy- We discussed the role of the chemo care clinic and identified patient specific risk factors. I discussed that patient was identified as high risk primarily based on: Stage of disease.   Patient has past medical history positive for: Past Medical History:  Diagnosis Date  . Depression   . Hypertension   . Pre-diabetes      Patient has past surgical history positive for: Past Surgical History:  Procedure Laterality Date  . BREAST BIOPSY N/A 30 + years   neg    Based on our high risk symptom management report; this patient has a high risk of ED utilization.  The percentage below indicates how "at risk "  this patient based on the factors in this table within one year.  General Risk Score: 4  Values used to calculate this score:   Points  Metrics      1        Age: [facility] Admissions: 0      0        ED Visits: 0      0        Has Chronic Obstructive Pulmonary Disease: No      1        Has Diabetes: Yes      0        Has Congestive Heart Failure: No      1        Has liver disease: Yes      1        Has Depression: Yes      0        Current PCP: Idelle Crouch, MD      0        Has Medicaid: No    3. We discussed that social determinants of health may have significant impacts on health and outcomes for cancer patients.  Today we discussed specific social determinants of performance status, alcohol use, depression, financial needs, food insecurity, housing, interpersonal violence, social connections, stress, tobacco use, and transportation.    After lengthy discussion the following were identified as areas of need:   Transportation: Patient will need transportation once both her son and  daughter return home.   Financial support: Help with food and medications.  Mrs. Sipos is retired and does not have a source of income.  Her son and daughter are in town to help with coordinating her care.  They have been paying for her medications out-of-pocket.  Patient is covered by Medicare.  Outpatient services: We discussed options including home based and outpatient services, DME and care program. We discusssed that patients who participate in regular physical activity report fewer negative impacts of cancer and treatments and report less fatigue.   Email was sent to Elease Etienne our  social worker to help address these concerns.  Financial Concerns: We discussed that living with cancer can create tremendous financial burden.  We discussed options for assistance. I asked that if assistance is needed in affording medications or paying bills to please let us know so that we can provide assistance. We discussed options for food including social services, [employer] ($50 every 2 weeks) and onsite food pantry.  We will also notify Barnabas Lister crater to see if cancer center can provide additional support.  Referral to Social work: Introduced Education officer, museum Elease Etienne and the services he can provide such as support with [employer], cell phone and gas vouchers.   Support groups: We discussed options for support groups at the cancer center. If interested, please notify nurse navigator to enroll. We discussed options for managing stress including healthy eating, exercise as well as participating in no charge counseling services at the cancer center and support groups.  If these are of interest, patient can notify either myself or primary nursing team.We discussed options for management including medications and referral to quit Smart program  Transportation: We discussed options for transportation including acta, paratransit, bus routes, link transit, taxi/uber/lyft, and cancer center [place].  I have notified primary oncology team who will help assist with arranging Lucianne Lei transportation for appointments when/if needed. We also discussed options for transportation on short notice/acute visits.  Patient will need transportation once her son and daughter leave.  Palliative care services: We have palliative care services available in the cancer center to discuss goals of care and advanced care planning.  Please let us know if you have any questions or would like to speak to our palliative nurse practitioner.  Symptom Management Clinic: We discussed our symptom management clinic which is available  for acute concerns while receiving treatment such as nausea, vomiting or diarrhea.  We can be reached via telephone at [phone] or through my chart.  We are available for virtual or in person visits on the same day from 830 to 4 PM Monday through Friday. She denies needing specific assistance at this time and She will be followed by Dr. Grayland Ormond clinical team.  Plan: Discussed symptom management clinic. Discussed palliative care services.  Patient has met with Merrily Pew Borders at our cancer center palliative care nurse practitioner and is being seen by community based palliative care tomorrow. Discussed resources that are available here at the cancer center. Discussed medications and new prescriptions to begin treatment such as anti-nausea or steroids.  Emailed Barnabas Lister crater.  Once I receive the okay, I will [facility]. Can contact son Ryllie Nieland for immediate concerns at [phone]  Disposition: RTC on Friday, 12/24/2019 for port placement. RTC on Monday, 12/27/2019 with lab work and to discuss further treatment planning with Dr. Grayland Ormond.  Visit Diagnosis 1. Adenocarcinoma of colon [facility])     Patient  expressed understanding and was in agreement with this plan. She also understands that She can call clinic at any time with any questions, concerns, or complaints.   Greater than 50% was spent in counseling and coordination of care with this patient including but not limited to discussion of the relevant topics above (See A&P) including, but not limited to diagnosis and management of acute and chronic medical conditions.   [place] [facility]  CC: Dr. Grayland Ormond

## 2019-12-22 ENCOUNTER — Telehealth: Payer: Self-pay

## 2019-12-22 ENCOUNTER — Other Ambulatory Visit: Payer: Medicare Other | Admitting: Nurse Practitioner

## 2019-12-22 ENCOUNTER — Other Ambulatory Visit: Payer: Self-pay | Admitting: Radiology

## 2019-12-22 ENCOUNTER — Encounter: Payer: Self-pay | Admitting: Nurse Practitioner

## 2019-12-22 DIAGNOSIS — Z515 Encounter for palliative care: Secondary | ICD-10-CM

## 2019-12-22 DIAGNOSIS — C189 Malignant neoplasm of colon, unspecified: Secondary | ICD-10-CM

## 2019-12-22 NOTE — Progress Notes (Signed)
[facility] Consult Note Telephone: [phone]  Fax: [phone]  PATIENT NAME: Suzanne Colon DOB: 06-01-1941 MRN: [id number]  PRIMARY CARE PROVIDER:   Idelle Crouch, MD  REFERRING PROVIDER:  Idelle Crouch, MD Suzanne Colon,   [id number]  RESPONSIBLE PARTY:   Suzanne Colon son [id number] or Suzanne Colon daughter [id number]  Due to the COVID-19 crisis, this visit was done via telemedicine from my office and it was initiated and consent by this patient and or family.   RECOMMENDATIONS and PLAN:  1. ACP: Made DNR and placed in Vynca; Hard Choice book and blank MOST form mailed to review next PC visit.  Suzanne Colon does want to try palliative chemotherapy. Suzanne Colon endorses if she has suffering side effects she would want to discontinue and have hospice care. Her husband was under hospice care when he passed so Suzanne Colon is familiar with hospice services.    2. Palliative care encounter; Palliative medicine team will continue to support patient, patient's family, and medical team. Visit consisted of counseling and education dealing with the complex and emotionally intense issues of symptom management and palliative care in the setting of serious and potentially life-threatening illness  I spent 65 minutes providing this consultation,  from 9:00am to 10:05am. More than 50% of the time in this consultation was spent coordinating communication.   HISTORY OF PRESENT ILLNESS:  Suzanne JOKINEN is a 79 y.o. 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 Initial diagnosis of adenocarcinoma of: with metastasis to lung  and liver with new diagnosis. CT scan results from 1 / 15 / 2021 and 1 / 29 / 2021 with multiple hepatic and Pulmonary lesions compatible with metastatic disease. 2 / 15 / 2021 ultrasound-guided liver biopsy confirmed adenocarcinoma likely origin. Tumor board reviewed case. Suzanne Colon was seen by Dr. [place] Colon oncology 2 / 25 / 2021 with recurrent nausea and significant weight loss. Suzanne Colon was started on dexamethasone with zofran with improvement of nausea and appetite. Plan is to have port-a-cath place this coming Friday with initiation of palliative chemotherapy FOLFOX plus a possible biologic agent depending on her mutational status. Dietitian has been working with Suzanne Colon. Vicodin has help abdominal pain. Suzanne Colon and her son Suzanne Colon did attend a chemotherapy class yesterday. Scheduled initial palliative care visit for telemedicine, telephonic is video not available. I called Suzanne. Colon and her son Suzanne Colon for scheduled visit. Suzanne. Colon and on both in agreement after explaining purpose for palliative care visit. We talked about past medical history. We talked about recent diagnosis of cancer. We talked about plan for upcoming port-a-cath placement and chemotherapy. Suzanne Colon's concerned about difficulty with Transportation due to increase in weakness and fatigue that Suzanne Colon is experiencing. Suzanne Colon endorses his sister found Suzanne Colon about 3 weeks ago with significant weight loss, fatigued, nauseous and unable to get out of bed. Suzanne Colon sister took Suzanne Colon to see Dr Doy Hutching ordered diagnostic testing which resulted in CA dx. Also at that time Suzanne Deni son was residing with her and APS was notified. Since Suzanne Colon appointment with Dr. Grayland Colon oncology where she was started on zofran and dexamethasone she has felt much better. She's gained about 5  lbs. Suzanne. Colon energy level has improved. Suzanne. Colon is able to ambulate short distances at home. No recent falls. Suzanne. Colon has to use a  wheelchair for doctor's visits. Pain has been helped by Vicodin. Suzanne Colon has physical therapy coming to the home today. Suzanne Colon talked about Suzanne Colon and he attending a chemotherapy education class yesterday. Suzanne Colon endorses he was able to get her into the car, after the session though which lasted three hours. Suzanne. Colon was exhausted. Suzanne Colon had significant problems getting her up the stairs and back into the home. Suzanne Colon asked if there was any form of transportation assistance to get her to and from appointments. Suzanne Colon asked about further resources. Suzanne Colon endorses that [facility] social worker was looking into options. We talked about palliative care social worker and on an agreement to have PC SW. Referral made. We talked about this Suzanne Colon's nausea which has improved. We talked about her work with the dietitian. Suzanne Colon endorses that he does have porta-cath placement coming up and initial dose of chemotherapy. Suzanne. Colon endorses that she is going to try chemotherapy but if she has many side effects she may consider stopping since palliative chemotherapy, not curative. We talked about medical goals of care. We talked about post status as she currently is a full code. Suzanne. Colon endorses her wishes are to be a DNR. Suzanne. Colon in agreement to have [place] completed, mailed in addition to blank MOST form and Hard Choice book to be completed at next palliative care visit. We talked at length about role of palliative care and plan of care. We talked about this next week they have a lot happening with initiation of chemotherapy. We scheduled a follow-up palliative care visit in two weeks or sooner should Suzanne Colon declined. Suzanne Colon and Suzanne. Colon in agreement. Therapeutic listening and emotional support provided. Contact information provided. Questions answered to satisfaction. Appointment scheduled. Palliative Care was asked to help to continue to address goals of care.   CODE STATUS: DNR  PPS: 40% HOSPICE  ELIGIBILITY/DIAGNOSIS: TBD  PAST MEDICAL HISTORY:  Past Medical History:  Diagnosis Date  . Depression   . Hypertension   . Pre-diabetes     SOCIAL HX:  Social History   Tobacco Use  . Smoking status: Former Research scientist (life sciences)  . Smokeless tobacco: Never Used  . Tobacco comment: 1990's  Substance Use Topics  . Alcohol use: Not Currently    ALLERGIES:  Allergies  Allergen Reactions  . Atorvastatin Other (See Comments)    Flu like symptoms  . Clarithromycin Other (See Comments)  . Codeine Rash  . Sulfa Antibiotics Rash     PERTINENT MEDICATIONS:  Outpatient Encounter Medications as of 12/22/2019  Medication Sig  . amLODipine (NORVASC) 10 MG tablet TAKE 1 TABLET DAILY  . aspirin 81 MG EC tablet Take by mouth.  Marland Kitchen atenolol (TENORMIN) 50 MG tablet TAKE 1 TABLET DAILY  . Biotin 5 MG CAPS Take by mouth.  Marland Kitchen buPROPion (WELLBUTRIN) 75 MG tablet TAKE 1 TABLET DAILY  . dexamethasone (DECADRON) 4 MG tablet Take 1 tablet (4 mg total) by mouth daily.  . fentaNYL (DURAGESIC) 12 MCG/HR Place 1 patch onto the skin every 3 (three) days.  Marland Kitchen HYDROcodone-acetaminophen (NORCO) 5-325 MG tablet Take 1 tablet by mouth every 4 (four) hours as needed for moderate pain.  . hydrocortisone (ANUSOL-HC) 25 MG suppository Place 25 mg rectally 2 (two) times daily.  . hydrocortisone 2.5 % cream APPLY TO AFFECTED AREA TWICE A DAY FOR 7  DAYS  . ketoconazole (NIZORAL) 2 % cream Apply topically.  . ondansetron (ZOFRAN) 8 MG tablet Take 1 tablet (8 mg total) by mouth every 8 (eight) hours as needed for nausea or vomiting.  . promethazine (PHENERGAN) 25 MG tablet Take 1 tablet (25 mg total) by mouth every 6 (six) hours as needed for nausea or vomiting.  . sertraline (ZOLOFT) 100 MG tablet Take by mouth.  . vitamin B-12 (CYANOCOBALAMIN) 100 MCG tablet Take by mouth.   No facility-administered encounter medications on file as of 12/22/2019.    PHYSICAL EXAM:   deferred  Amandine Covino Ihor Gully, NP

## 2019-12-23 ENCOUNTER — Other Ambulatory Visit: Payer: Self-pay | Admitting: Radiology

## 2019-12-23 ENCOUNTER — Telehealth: Payer: Self-pay | Admitting: Oncology

## 2019-12-23 LAB — SURGICAL PATHOLOGY

## 2019-12-23 MED ORDER — ONDANSETRON HCL 8 MG PO TABS: 8.0000 mg | ORAL_TABLET | Freq: Three times a day (TID) | ORAL | 1 refills | Status: AC | PRN

## 2019-12-23 NOTE — Telephone Encounter (Signed)
Re: Medications  Spoke to [facility] who agreed that we could cover her medications that are needed for her cancer care. I refilled an RX for Zofran and sent to [facility] under the [employer].   Barnabas Lister to call and discuss other support we can offer the patient.   Mellody Dance (patients son) updated.   Faythe Casa, NP 12/23/2019 2:34 PM

## 2019-12-23 NOTE — Telephone Encounter (Signed)
SW received referral from [place], NP regarding concerns for transportation. SW contacted Timmothy Sours (patient's son) to discuss. Timmothy Sours said his main concern is getting patient in and out of the car that he rented because it is so low. Timmothy Sours said he may go switch for a SUV. SW also provided information on [employer] and [employer]. Timmothy Sours was appreciative of phone call and information.

## 2019-12-23 NOTE — Addendum Note (Signed)
Addended by: Faythe Casa E on: 12/23/2019 02:32 PM   Modules accepted: Orders

## 2019-12-24 ENCOUNTER — Other Ambulatory Visit: Payer: Self-pay

## 2019-12-24 ENCOUNTER — Ambulatory Visit
Admission: RE | Admit: 2019-12-24 | Discharge: 2019-12-24 | Disposition: A | Discharge status: Home / Self Care | Payer: Medicare Other | Source: Ambulatory Visit | Attending: Oncology | Admitting: Oncology

## 2019-12-24 ENCOUNTER — Telehealth: Payer: Self-pay | Admitting: Emergency Medicine

## 2019-12-24 DIAGNOSIS — Z885 Allergy status to narcotic agent status: Secondary | ICD-10-CM | POA: Diagnosis not present

## 2019-12-24 DIAGNOSIS — Z888 Allergy status to other drugs, medicaments and biological substances status: Secondary | ICD-10-CM | POA: Diagnosis not present

## 2019-12-24 DIAGNOSIS — Z87891 Personal history of nicotine dependence: Secondary | ICD-10-CM | POA: Diagnosis not present

## 2019-12-24 DIAGNOSIS — Z882 Allergy status to sulfonamides status: Secondary | ICD-10-CM | POA: Insufficient documentation

## 2019-12-24 DIAGNOSIS — Z7982 Long term (current) use of aspirin: Secondary | ICD-10-CM | POA: Insufficient documentation

## 2019-12-24 DIAGNOSIS — Z79891 Long term (current) use of opiate analgesic: Secondary | ICD-10-CM | POA: Insufficient documentation

## 2019-12-24 DIAGNOSIS — C189 Malignant neoplasm of colon, unspecified: Secondary | ICD-10-CM | POA: Insufficient documentation

## 2019-12-24 DIAGNOSIS — R7303 Prediabetes: Secondary | ICD-10-CM | POA: Insufficient documentation

## 2019-12-24 DIAGNOSIS — Z79899 Other long term (current) drug therapy: Secondary | ICD-10-CM | POA: Diagnosis not present

## 2019-12-24 DIAGNOSIS — Z881 Allergy status to other antibiotic agents status: Secondary | ICD-10-CM | POA: Diagnosis not present

## 2019-12-24 DIAGNOSIS — F329 Major depressive disorder, single episode, unspecified: Secondary | ICD-10-CM | POA: Diagnosis not present

## 2019-12-24 DIAGNOSIS — I10 Essential (primary) hypertension: Secondary | ICD-10-CM | POA: Diagnosis not present

## 2019-12-24 HISTORY — DX: Malignant neoplasm of colon, unspecified: C18.9

## 2019-12-24 HISTORY — PX: IR IMAGING GUIDED PORT INSERTION: IMG5740

## 2019-12-24 MED ORDER — CEFAZOLIN SODIUM-DEXTROSE 2-4 GM/100ML-% IV SOLN: 2.0000 g | INTRAVENOUS | Status: DC

## 2019-12-24 MED ORDER — CEFAZOLIN SODIUM-DEXTROSE 2-4 GM/100ML-% IV SOLN
INTRAVENOUS | Status: AC
  Filled 2019-12-24: qty 100

## 2019-12-24 MED ORDER — FENTANYL CITRATE (PF) 100 MCG/2ML IJ SOLN
INTRAMUSCULAR | Status: DC | PRN
  Administered 2019-12-24: 50 ug via INTRAVENOUS

## 2019-12-24 MED ORDER — FENTANYL CITRATE (PF) 100 MCG/2ML IJ SOLN
INTRAMUSCULAR | Status: AC
  Administered 2019-12-24: 50 ug via INTRAVENOUS
  Filled 2019-12-24: qty 2

## 2019-12-24 MED ORDER — HEPARIN SOD (PORK) LOCK FLUSH 100 UNIT/ML IV SOLN
INTRAVENOUS | Status: AC
  Filled 2019-12-24: qty 5

## 2019-12-24 MED ORDER — MIDAZOLAM HCL 2 MG/2ML IJ SOLN
INTRAMUSCULAR | Status: DC | PRN
  Administered 2019-12-24: 1 mg via INTRAVENOUS

## 2019-12-24 MED ORDER — MIDAZOLAM HCL 5 MG/5ML IJ SOLN
INTRAMUSCULAR | Status: AC
  Filled 2019-12-24: qty 5

## 2019-12-24 MED ORDER — SODIUM CHLORIDE 0.9 % IV SOLN: INTRAVENOUS | Status: DC

## 2019-12-24 MED ORDER — SODIUM CHLORIDE 0.9 % IV SOLN
8.0000 mg | Freq: Once | INTRAVENOUS | Status: AC
  Administered 2019-12-24: 8 mg via INTRAVENOUS
  Filled 2019-12-24: qty 4

## 2019-12-24 MED ORDER — ONDANSETRON HCL 4 MG/2ML IJ SOLN
INTRAMUSCULAR | Status: AC
  Filled 2019-12-24: qty 4

## 2019-12-24 NOTE — Telephone Encounter (Signed)
Pt's son Suzanne Colon transferred to my desk to ask question regarding being told that his mother would start chemo on Monday but being told by team scheduler that she was not scheduled for that. States that he was told by Suzanne Abide, NP that pt would be starting chemo on Monday. Explained that the plan was to have the patient come in on Monday to discuss treatment planning based on pt's performance status. Pt's son verbalized understanding, and asked when she would be starting treatment. Suzanne Colon that I would speak with Suzanne Colon and call him back. Per Suzanne Colon, depending on how pt is looking Monday, we would get her scheduled for chemo the following Monday. Don called back and told Suzanne Colon response. Verbalized understanding, no further questions or concerns.

## 2019-12-24 NOTE — Discharge Instructions (Signed)
Implanted Port Insertion, Care After °This sheet gives you information about how to care for yourself after your procedure. Your health care provider may also give you more specific instructions. If you have problems or questions, contact your health care provider. °What can I expect after the procedure? °After the procedure, it is common to have: °· Discomfort at the port insertion site. °· Bruising on the skin over the port. This should improve over 3-4 days. °Follow these instructions at home: °Port care °· After your port is placed, you will get a manufacturer's information card. The card has information about your port. Keep this card with you at all times. °· Take care of the port as told by your health care provider. Ask your health care provider if you or a family member can get training for taking care of the port at home. A home health care nurse may also take care of the port. °· Make sure to remember what type of port you have. °Incision care ° °  ° °· Follow instructions from your health care provider about how to take care of your port insertion site. Make sure you: °? Wash your hands with soap and water before and after you change your bandage (dressing). If soap and water are not available, use hand sanitizer. °? Change your dressing as told by your health care provider. °? Leave stitches (sutures), skin glue, or adhesive strips in place. These skin closures may need to stay in place for 2 weeks or longer. If adhesive strip edges start to loosen and curl up, you may trim the loose edges. Do not remove adhesive strips completely unless your health care provider tells you to do that. °· Check your port insertion site every day for signs of infection. Check for: °? Redness, swelling, or pain. °? Fluid or blood. °? Warmth. °? Pus or a bad smell. °Activity °· Return to your normal activities as told by your health care provider. Ask your health care provider what activities are safe for you. °· Do not  lift anything that is heavier than 10 lb (4.5 kg), or the limit that you are told, until your health care provider says that it is safe. °General instructions °· Take over-the-counter and prescription medicines only as told by your health care provider. °· Do not take baths, swim, or use a hot tub until your health care provider approves. Ask your health care provider if you may take showers. You may only be allowed to take sponge baths. °· Do not drive for 24 hours if you were given a sedative during your procedure. °· Wear a medical alert bracelet in case of an emergency. This will tell any health care providers that you have a port. °· Keep all follow-up visits as told by your health care provider. This is important. °Contact a health care provider if: °· You cannot flush your port with saline as directed, or you cannot draw blood from the port. °· You have a fever or chills. °· You have redness, swelling, or pain around your port insertion site. °· You have fluid or blood coming from your port insertion site. °· Your port insertion site feels warm to the touch. °· You have pus or a bad smell coming from the port insertion site. °Get help right away if: °· You have chest pain or shortness of breath. °· You have bleeding from your port that you cannot control. °Summary °· Take care of the port as told by your health   care provider. Keep the manufacturer's information card with you at all times. °· Change your dressing as told by your health care provider. °· Contact a health care provider if you have a fever or chills or if you have redness, swelling, or pain around your port insertion site. °· Keep all follow-up visits as told by your health care provider. °This information is not intended to replace advice given to you by your health care provider. Make sure you discuss any questions you have with your health care provider. °Document Revised: 05/05/2018 Document Reviewed: 05/05/2018 °Elsevier Patient Education ©  2020 [employer] °Moderate Conscious Sedation, Adult, Care After °These instructions provide you with information about caring for yourself after your procedure. Your health care provider may also give you more specific instructions. Your treatment has been planned according to current medical practices, but problems sometimes occur. Call your health care provider if you have any problems or questions after your procedure. °What can I expect after the procedure? °After your procedure, it is common: °· To feel sleepy for several hours. °· To feel clumsy and have poor balance for several hours. °· To have poor judgment for several hours. °· To vomit if you eat too soon. °Follow these instructions at home: °For at least 24 hours after the procedure: ° °· Do not: °? Participate in activities where you could fall or become injured. °? Drive. °? Use heavy machinery. °? Drink alcohol. °? Take sleeping pills or medicines that cause drowsiness. °? Make important decisions or sign legal documents. °? Take care of children on your own. °· Rest. °Eating and drinking °· Follow the diet recommended by your health care provider. °· If you vomit: °? Drink water, juice, or soup when you can drink without vomiting. °? Make sure you have little or no nausea before eating solid foods. °General instructions °· Have a responsible adult stay with you until you are awake and alert. °· Take over-the-counter and prescription medicines only as told by your health care provider. °· If you smoke, do not smoke without supervision. °· Keep all follow-up visits as told by your health care provider. This is important. °Contact a health care provider if: °· You keep feeling nauseous or you keep vomiting. °· You feel light-headed. °· You develop a rash. °· You have a fever. °Get help right away if: °· You have trouble breathing. °This information is not intended to replace advice given to you by your health care provider. Make sure you discuss any  questions you have with your health care provider. °Document Revised: 09/19/2017 Document Reviewed: 01/27/2016 °Elsevier Patient Education © 2020 [employer] Inc. ° °

## 2019-12-24 NOTE — H&P (Signed)
Chief Complaint: Patient was seen in consultation today for port placement at the request of Finnegan,Timothy J  Referring Physician(s): Finnegan,Timothy J  Supervising Physician: Sandi Mariscal  Patient Status: [facility] - Out-pt  History of Present Illness: Suzanne Colon is a 79 y.o. female with colon cancer. She is to start chemotherapy soon and is referred for port placement. PMHx, meds, labs, imaging, allergies reviewed. Feels well aside from a restless night, but no recent fevers, chills, illness. Has been NPO today as directed.    Past Medical History:  Diagnosis Date  . Colon cancer ([place])   . Depression   . Hypertension   . Pre-diabetes     Past Surgical History:  Procedure Laterality Date  . BREAST BIOPSY N/A 30 + years   neg    Allergies: Atorvastatin, Clarithromycin, Codeine, and Sulfa antibiotics  Medications: Prior to Admission medications   Medication Sig Start Date End Date Taking? Authorizing Provider  amLODipine (NORVASC) 10 MG tablet TAKE 1 TABLET DAILY 06/23/19  Yes [provider]  aspirin 81 MG EC tablet Take by mouth.   Yes [provider]  atenolol (TENORMIN) 50 MG tablet TAKE 1 TABLET DAILY 06/23/19  Yes [provider]  buPROPion (WELLBUTRIN) 75 MG tablet TAKE 1 TABLET DAILY 06/23/19  Yes [provider]  dexamethasone (DECADRON) 4 MG tablet Take 1 tablet (4 mg total) by mouth daily. 12/16/19  Yes Borders, Kirt Boys, NP  fentaNYL (DURAGESIC) 12 MCG/HR Place 1 patch onto the skin every 3 (three) days. 12/16/19  Yes Borders, Kirt Boys, NP  HYDROcodone-acetaminophen (NORCO) 5-325 MG tablet Take 1 tablet by mouth every 4 (four) hours as needed for moderate pain. 12/16/19  Yes Borders, Kirt Boys, NP  hydrocortisone (ANUSOL-HC) 25 MG suppository Place 25 mg rectally 2 (two) times daily. 10/21/19  Yes [provider]  hydrocortisone 2.5 % cream APPLY TO AFFECTED AREA TWICE A DAY FOR 7 DAYS 10/21/19  Yes [provider]  ondansetron (ZOFRAN) 8 MG tablet Take 1 tablet (8 mg total) by mouth every 8 (eight) hours as needed for nausea or vomiting. 12/23/19  Yes Burns, Wandra Feinstein, NP  promethazine (PHENERGAN) 25 MG tablet Take 1 tablet (25 mg total) by mouth every 6 (six) hours as needed for nausea or vomiting. 12/20/19  Yes Lloyd Huger, MD  sertraline (ZOLOFT) 100 MG tablet Take by mouth. 01/11/19  Yes [provider]  vitamin B-12 (CYANOCOBALAMIN) 100 MCG tablet Take by mouth.   Yes [provider]  Biotin 5 MG CAPS Take by mouth.    [provider]  ketoconazole (NIZORAL) 2 % cream Apply topically.    [provider]     History reviewed. No pertinent family history.  Social History   Socioeconomic History  . Marital status: Married    Spouse name: Not on file  . Number of children: Not on file  . Years of education: Not on file  . Highest education level: Not on file  Occupational History  . Not on file  Tobacco Use  . Smoking status: Former Research scientist (life sciences)  . Smokeless tobacco: Never Used  . Tobacco comment: 1990's  Substance and Sexual Activity  . Alcohol use: Not Currently  . Drug use: Not Currently  . Sexual activity: Not on file  Other Topics Concern  . Not on file  Social History Narrative  . Not on file   Social Determinants of Health   Financial Resource Strain:   . Difficulty of Paying  Living Expenses: Not on file  Food Insecurity:   . Worried About Charity fundraiser in the Last Year: Not on file  . Ran Out of Food in the Last Year: Not on file  Transportation Needs:   . Lack of Transportation (Medical): Not on file  . Lack of Transportation (Non-Medical): Not on file  Physical Activity:   . Days of Exercise per Week: Not on file  . Minutes of Exercise per Session: Not on file  Stress:   . Feeling of Stress : Not on file  Social Connections:   . Frequency of Communication with Friends and Family: Not on file  . Frequency of  Social Gatherings with Friends and Family: Not on file  . Attends Religious Services: Not on file  . Active Member of Clubs or Organizations: Not on file  . Attends Archivist Meetings: Not on file  . Marital Status: Not on file    Review of Systems: A 12 point ROS discussed and pertinent positives are indicated in the HPI above.  All other systems are negative.  Review of Systems  Vital Signs: T: 97.3, HR: 98, BP: 156/87  Physical Exam Constitutional:      Appearance: Normal appearance.  HENT:     Mouth/Throat:     Mouth: Mucous membranes are moist.     Pharynx: Oropharynx is clear.  Cardiovascular:     Rate and Rhythm: Normal rate and regular rhythm.     Heart sounds: Normal heart sounds.  Pulmonary:     Effort: Pulmonary effort is normal. No respiratory distress.     Breath sounds: Normal breath sounds.  Skin:    General: Skin is warm and dry.  Neurological:     General: No focal deficit present.     Mental Status: She is alert and oriented to person, place, and time.  Psychiatric:        Mood and Affect: Mood normal.        Thought Content: Thought content normal.        Judgment: Judgment normal.     Imaging: US BIOPSY (LIVER)  Result Date: 12/06/2019 INDICATION: Weight loss, nausea, multiple confluent masses throughout the liver parenchyma and bilateral pulmonary nodules. The patient presents for liver lesion BIOPSY. EXAM: ULTRASOUND GUIDED CORE BIOPSY OF LIVER MEDICATIONS: None. ANESTHESIA/SEDATION: Fentanyl 2.0 mcg IV; Versed 100 mg IV Moderate Sedation Time:  21 minutes. The patient was continuously monitored during the procedure by the interventional radiology nurse under my direct supervision. PROCEDURE: The procedure, risks, benefits, and alternatives were explained to the patient. Questions regarding the procedure were encouraged and answered. The patient understands and consents to the procedure. A time-out was performed prior to initiating the  procedure. Ultrasound was performed of the liver. The right abdominal wall was prepped with chlorhexidine in a sterile fashion, and a sterile drape was applied covering the operative field. A sterile gown and sterile gloves were used for the procedure. Local anesthesia was provided with 1% Lidocaine. Under ultrasound guidance, a 17 gauge trocar needle was advanced into the inferior aspect of the right lobe of the liver. After confirming needle tip position, coaxial 18 gauge core biopsy samples were obtained. Three total core biopsy samples were submitted in formalin. Additional post biopsy ultrasound was performed after outer needle removal. COMPLICATIONS: None immediate. FINDINGS: The liver is filled with ill-defined confluent relatively hyperechoic mass lesions that blend into one another and are difficult to discretely measure. Area of abnormal tissue in the  inferior right lobe was targeted with solid tissue obtained. IMPRESSION: Ultrasound-guided core biopsy performed lesion within the inferior right lobe parenchyma. Multiple ill-defined confluent masses blend into one another by ultrasound and are relatively hyperechoic. Electronically Signed   By: Aletta Edouard M.D.   On: 12/06/2019 11:18    Labs:  CBC: Recent Labs    11/25/19 0913 12/16/19 0903  WBC 12.4* 14.7*  HGB 11.9* 11.4*  HCT 38.5 37.8  PLT 263 235    COAGS: Recent Labs    11/25/19 0913  INR 1.1  APTT 31    BMP: Recent Labs    11/25/19 0913 12/16/19 0903  NA 133* 135  K 3.5 3.8  CL 97* 96*  CO2 24 25  GLUCOSE 151* 252*  BUN 16 18  CALCIUM 9.3 8.9  CREATININE 0.83 0.81  GFRNONAA >60 >60  GFRAA >60 >60    LIVER FUNCTION TESTS: Recent Labs    11/25/19 0913 12/16/19 0903  BILITOT 1.1 1.4*  AST 61* 73*  ALT 51* 56*  ALKPHOS 215* 262*  PROT 7.0 6.9  ALBUMIN 3.1* 2.8*    TUMOR MARKERS: No results for input(s): AFPTM, CEA, CA199, CHROMGRNA in the last 8760 hours.  Assessment and Plan: Colon  cancer For port placement Risks and benefits of image guided port-a-catheter placement was discussed with the patient including, but not limited to bleeding, infection, pneumothorax, or fibrin sheath development and need for additional procedures.  All of the patient's questions were answered, patient is agreeable to proceed. Consent signed and in chart.    Thank you for this interesting consult.  I greatly enjoyed meeting Suzanne Colon and look forward to participating in their care.  A copy of this report was sent to the requesting provider on this date.  Electronically Signed: Ascencion Dike, PA-C 12/24/2019, 9:16 AM   I spent a total of 20 minutes in face to face in clinical consultation, greater than 50% of which was counseling/coordinating care for port

## 2019-12-24 NOTE — Procedures (Signed)
Pre Procedure Dx: Colon cancer ?Post Procedural Dx: Same ? ?Successful placement of right IJ approach port-a-cath with tip at the superior caval atrial junction. ?The catheter is ready for immediate use. ? ?Estimated Blood Loss: Minimal ? ?Complications: None immediate. ? ?Jay Liesel Peckenpaugh, MD ?Pager #: [phone] ? ? ?

## 2019-12-26 NOTE — Progress Notes (Signed)
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  INTERVAL HISTORY: Patient returns to clinic today for further evaluation and treatment planning.  She continues to have significant weakness and fatigue, but her pain is better controlled and she is sleeping better.  She does not complain of nausea today.  She had no neurologic complaints.  She denies any recent fevers or illnesses.  She has no chest pain, shortness of breath, cough, or hemoptysis.  She has small frequent bowel movements, but does not endorse diarrhea.  She has no urinary complaints.  Patient offers no further specific complaints today.  REVIEW OF SYSTEMS:   Review of Systems  Constitutional: Positive for malaise/fatigue. Negative for fever and weight loss.  Respiratory: Negative.  Negative for cough, hemoptysis and shortness of breath.   Cardiovascular: Negative.  Negative for chest pain and leg swelling.  Gastrointestinal: Negative for abdominal pain, blood in stool, constipation, diarrhea, melena, nausea and vomiting.  Genitourinary: Negative.  Negative for dysuria and hematuria.  Musculoskeletal: Negative.  Negative for back pain.  Skin: Negative.  Negative for rash.  Neurological: Positive for weakness. Negative for dizziness, focal weakness and headaches.  Psychiatric/Behavioral: Negative.  The patient is not nervous/anxious and does not have insomnia.     As per HPI. Otherwise, a complete review of systems is negative.  PAST MEDICAL HISTORY: Past Medical History:  Diagnosis Date  . Colon cancer ([place])   . Depression   . Hypertension   . Pre-diabetes     PAST SURGICAL HISTORY: Past Surgical History:    Procedure Laterality Date  . BREAST BIOPSY N/A 30 + years   neg  . IR IMAGING GUIDED PORT INSERTION  12/24/2019    FAMILY HISTORY: No family history on file.  ADVANCED DIRECTIVES (Y/N):  N  HEALTH MAINTENANCE: Social History   Tobacco Use  . Smoking status: Former Research scientist (life sciences)  . Smokeless tobacco: Never Used  . Tobacco comment: 1990's  Substance Use Topics  . Alcohol use: Not Currently  . Drug use: Not Currently     Colonoscopy:  PAP:  Bone density:  Lipid panel:  Allergies  Allergen Reactions  . Atorvastatin Other (See Comments)    Flu like symptoms  . Clarithromycin Other (See Comments)  . Codeine Rash  . Sulfa Antibiotics Rash    Current Outpatient Medications  Medication Sig Dispense Refill  . amLODipine (NORVASC) 10 MG tablet TAKE 1 TABLET DAILY    . aspirin 81 MG EC tablet Take by mouth.    Marland Kitchen atenolol (TENORMIN) 50 MG tablet TAKE 1 TABLET DAILY    . Biotin 5 MG CAPS Take by mouth.    Marland Kitchen buPROPion (WELLBUTRIN) 75 MG tablet TAKE 1 TABLET DAILY    . dexamethasone (DECADRON) 4 MG tablet Take 1 tablet (4 mg total) by mouth daily. 15 tablet 0  . fentaNYL (DURAGESIC) 12 MCG/HR Place 1 patch onto the skin every 3 (three) days. 10 patch 0  . HYDROcodone-acetaminophen (NORCO) 5-325 MG tablet Take 1 tablet by mouth every 4 (four) hours as needed for moderate pain. 60 tablet 0  . hydrocortisone (ANUSOL-HC) 25 MG suppository Place 25 mg rectally 2 (two) times daily.    . hydrocortisone 2.5 % cream APPLY  TO AFFECTED AREA TWICE A DAY FOR 7 DAYS    . ketoconazole (NIZORAL) 2 % cream Apply topically.    . ondansetron (ZOFRAN) 8 MG tablet Take 1 tablet (8 mg total) by mouth every 8 (eight) hours as needed for nausea or vomiting. 60 tablet 1  . promethazine (PHENERGAN) 25 MG tablet Take 1 tablet (25 mg total) by mouth every 6 (six) hours as needed for nausea or vomiting. 60 tablet 0  . sertraline (ZOLOFT) 100 MG tablet Take by mouth.    . vitamin B-12 (CYANOCOBALAMIN) 100 MCG tablet  Take by mouth.     No current facility-administered medications for this visit.    OBJECTIVE: Vitals:   12/27/19 1003  BP: 140/80  Pulse: 93  Resp: 18  Temp: (!) 96.3 F (35.7 C)  SpO2: 100%     Body mass index is 34.52 kg/m.    ECOG FS:1 - Symptomatic but completely ambulatory  General: Well-developed, well-nourished, no acute distress. Eyes: Pink conjunctiva, anicteric sclera. HEENT: Normocephalic, moist mucous membranes. Lungs: No audible wheezing or coughing. Heart: Regular rate and rhythm. Abdomen: Soft, nontender, no obvious distention. Musculoskeletal: No edema, cyanosis, or clubbing. Neuro: Alert, answering all questions appropriately. Cranial nerves grossly intact. Skin: No rashes or petechiae noted. Psych: Normal affect.   LAB RESULTS:  Lab Results  Component Value Date   NA 133 (L) 12/27/2019   K 4.5 12/27/2019   CL 97 (L) 12/27/2019   CO2 22 12/27/2019   GLUCOSE 142 (H) 12/27/2019   BUN 25 (H) 12/27/2019   CREATININE 0.88 12/27/2019   CALCIUM 9.3 12/27/2019   PROT 7.0 12/27/2019   ALBUMIN 2.9 (L) 12/27/2019   AST 123 (H) 12/27/2019   ALT 84 (H) 12/27/2019   ALKPHOS 407 (H) 12/27/2019   BILITOT 0.9 12/27/2019   GFRNONAA >60 12/27/2019   GFRAA >60 12/27/2019    Lab Results  Component Value Date   WBC 19.8 (H) 12/27/2019   NEUTROABS 15.5 (H) 12/27/2019   HGB 11.1 (L) 12/27/2019   HCT 36.9 12/27/2019   MCV 84.8 12/27/2019   PLT 309 12/27/2019     STUDIES: US BIOPSY (LIVER)  Result Date: 12/06/2019 INDICATION: Weight loss, nausea, multiple confluent masses throughout the liver parenchyma and bilateral pulmonary nodules. The patient presents for liver lesion BIOPSY. EXAM: ULTRASOUND GUIDED CORE BIOPSY OF LIVER MEDICATIONS: None. ANESTHESIA/SEDATION: Fentanyl 2.0 mcg IV; Versed 100 mg IV Moderate Sedation Time:  21 minutes. The patient was continuously monitored during the procedure by the interventional radiology nurse under my direct  supervision. PROCEDURE: The procedure, risks, benefits, and alternatives were explained to the patient. Questions regarding the procedure were encouraged and answered. The patient understands and consents to the procedure. A time-out was performed prior to initiating the procedure. Ultrasound was performed of the liver. The right abdominal wall was prepped with chlorhexidine in a sterile fashion, and a sterile drape was applied covering the operative field. A sterile gown and sterile gloves were used for the procedure. Local anesthesia was provided with 1% Lidocaine. Under ultrasound guidance, a 17 gauge trocar needle was advanced into the inferior aspect of the right lobe of the liver. After confirming needle tip position, coaxial 18 gauge core biopsy samples were obtained. Three total core biopsy samples were submitted in formalin. Additional post biopsy ultrasound was performed after outer needle removal. COMPLICATIONS: None immediate. FINDINGS: The liver is filled with ill-defined confluent relatively hyperechoic mass lesions that blend into one another and are difficult to discretely measure. Area  of abnormal tissue in the inferior right lobe was targeted with solid tissue obtained. IMPRESSION: Ultrasound-guided core biopsy performed lesion within the inferior right lobe parenchyma. Multiple ill-defined confluent masses blend into one another by ultrasound and are relatively hyperechoic. Electronically Signed   By: Aletta Edouard M.D.   On: 12/06/2019 11:18   IR IMAGING GUIDED PORT INSERTION  Result Date: 12/24/2019 INDICATION: History of metastatic colon cancer. In need of durable intravenous access for chemotherapy administration. EXAM: IMPLANTED PORT A CATH PLACEMENT WITH ULTRASOUND AND FLUOROSCOPIC GUIDANCE COMPARISON:  Chest CT-11/05/2019 MEDICATIONS: Ancef 2 gm IV; The antibiotic was administered within an appropriate time interval prior to skin puncture. ANESTHESIA/SEDATION: Moderate (conscious)  sedation was employed during this procedure. A total of Versed 1 mg and Fentanyl 50 mcg was administered intravenously. Moderate Sedation Time: 15 minutes. The patient's level of consciousness and vital signs were monitored continuously by radiology nursing throughout the procedure under my direct supervision. CONTRAST:  None FLUOROSCOPY TIME:  24 seconds (9.98 mGy) COMPLICATIONS: None immediate. PROCEDURE: The procedure, risks, benefits, and alternatives were explained to the patient. Questions regarding the procedure were encouraged and answered. The patient understands and consents to the procedure. The right neck and chest were prepped with chlorhexidine in a sterile fashion, and a sterile drape was applied covering the operative field. Maximum barrier sterile technique with sterile gowns and gloves were used for the procedure. A timeout was performed prior to the initiation of the procedure. Local anesthesia was provided with 1% lidocaine with epinephrine. After creating a small venotomy incision, a micropuncture kit was utilized to access the internal jugular vein. Real-time ultrasound guidance was utilized for vascular access including the acquisition of a permanent ultrasound image documenting patency of the accessed vessel. The microwire was utilized to measure appropriate catheter length. A subcutaneous port pocket was then created along the upper chest wall utilizing a combination of sharp and blunt dissection. The pocket was irrigated with sterile saline. A single lumen "standard sized" power injectable port was chosen for placement. The 8 Fr catheter was tunneled from the port pocket site to the venotomy incision. The port was placed in the pocket. The external catheter was trimmed to appropriate length. At the venotomy, an 8 Fr peel-away sheath was placed over a guidewire under fluoroscopic guidance. The catheter was then placed through the sheath and the sheath was removed. Final catheter positioning  was confirmed and documented with a fluoroscopic spot radiograph. The port was accessed with a Huber needle, aspirated and flushed with heparinized saline. The venotomy site was closed with an interrupted 4-0 Vicryl suture. The port pocket incision was closed with interrupted 2-0 Vicryl suture. Dermabond and Steri-strips were applied to both incisions. Dressings were placed. The patient tolerated the procedure well without immediate post procedural complication. FINDINGS: After catheter placement, the tip lies within the superior cavoatrial junction. The catheter aspirates and flushes normally and is ready for immediate use. IMPRESSION: Successful placement of a right internal jugular approach power injectable Port-A-Cath. The catheter is ready for immediate use. Electronically Signed   By: Sandi Mariscal M.D.   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  PLAN:    1. 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  Ultrasound-guided liver biopsy on December 06, 2019 confirmed adenocarcinoma likely of  colon origin, MSI stable.  K-ras/EGFR mutation are pending at time of dictation.  Patient had a port placement last week.  She now plans to move to [place] to live with her daughter.  Plan was to pursue palliative chemotherapy using FOLFOX plus a possible biologic agent depending on her mutational status.  No further follow-up has been scheduled.  Will attempt a referral to [facility] patient hopefully can initiate treatment in the next several weeks. 2.  Nausea: Continue Zofran and dexamethasone as prescribed. 3.  Poor appetite/weight loss: Improved. Continue dexamethasone. Appreciate dietary input. 4.  Insomnia: Improved.   5.  Frequent bowel movements:  The patient has not had a colonoscopy, it is likely she has a colon mass disrupting her normal motility.  Recommended daily stool softeners and continue to monitor closely.   6.  Abdominal pain: Improved. Continue Vicodin as needed. 7.  Elevated liver enzymes: Possibly secondary to metastatic disease.,  Bilirubin has improved.  Continue to monitor and initiate treatment in several weeks as above.   Patient expressed understanding and was in agreement with this plan. She also understands that She can call clinic at any time with any questions, concerns, or complaints.   Cancer Staging Adenocarcinoma of colon [facility]) Staging form: Colon and Rectum, AJCC 8th Edition - Clinical stage from 12/10/2019: Stage IVB (cTX, cNX, pM1b) - Signed by Lloyd Huger, MD on 12/10/2019   Lloyd Huger, MD   12/27/2019 1:10 PM

## 2019-12-27 ENCOUNTER — Encounter: Payer: Self-pay | Admitting: Oncology

## 2019-12-27 ENCOUNTER — Other Ambulatory Visit: Payer: Self-pay

## 2019-12-27 ENCOUNTER — Inpatient Hospital Stay (HOSPITAL_BASED_OUTPATIENT_CLINIC_OR_DEPARTMENT_OTHER): Payer: Medicare Other | Admitting: Oncology

## 2019-12-27 ENCOUNTER — Other Ambulatory Visit: Payer: Self-pay | Admitting: Emergency Medicine

## 2019-12-27 ENCOUNTER — Inpatient Hospital Stay: Payer: Medicare Other

## 2019-12-27 VITALS — BP 140/80 | HR 93 | Temp 96.3°F | Resp 18 | Wt 201.1 lb

## 2019-12-27 DIAGNOSIS — R110 Nausea: Secondary | ICD-10-CM | POA: Diagnosis not present

## 2019-12-27 DIAGNOSIS — C189 Malignant neoplasm of colon, unspecified: Secondary | ICD-10-CM

## 2019-12-27 DIAGNOSIS — C787 Secondary malignant neoplasm of liver and intrahepatic bile duct: Secondary | ICD-10-CM | POA: Diagnosis not present

## 2019-12-27 DIAGNOSIS — Z7952 Long term (current) use of systemic steroids: Secondary | ICD-10-CM | POA: Diagnosis not present

## 2019-12-27 DIAGNOSIS — C7800 Secondary malignant neoplasm of unspecified lung: Secondary | ICD-10-CM | POA: Diagnosis not present

## 2019-12-27 DIAGNOSIS — G4700 Insomnia, unspecified: Secondary | ICD-10-CM | POA: Diagnosis not present

## 2019-12-27 LAB — CBC WITH DIFFERENTIAL/PLATELET
Abs Immature Granulocytes: 0.14 10*3/uL — ABNORMAL HIGH (ref 0.00–0.07)
Basophils Absolute: 0 10*3/uL (ref 0.0–0.1)
Basophils Relative: 0 %
Eosinophils Absolute: 0 10*3/uL (ref 0.0–0.5)
Eosinophils Relative: 0 %
HCT: 36.9 % (ref 36.0–46.0)
Hemoglobin: 11.1 g/dL — ABNORMAL LOW (ref 12.0–15.0)
Immature Granulocytes: 1 %
Lymphocytes Relative: 11 %
Lymphs Abs: 2.2 10*3/uL (ref 0.7–4.0)
MCH: 25.5 pg — ABNORMAL LOW (ref 26.0–34.0)
MCHC: 30.1 g/dL (ref 30.0–36.0)
MCV: 84.8 fL (ref 80.0–100.0)
Monocytes Absolute: 1.8 10*3/uL — ABNORMAL HIGH (ref 0.1–1.0)
Monocytes Relative: 9 %
Neutro Abs: 15.5 10*3/uL — ABNORMAL HIGH (ref 1.7–7.7)
Neutrophils Relative %: 79 %
Platelets: 309 10*3/uL (ref 150–400)
RBC: 4.35 MIL/uL (ref 3.87–5.11)
RDW: 15.5 % (ref 11.5–15.5)
WBC: 19.8 10*3/uL — ABNORMAL HIGH (ref 4.0–10.5)
nRBC: 0 % (ref 0.0–0.2)

## 2019-12-27 LAB — COMPREHENSIVE METABOLIC PANEL
ALT: 84 U/L — ABNORMAL HIGH (ref 0–44)
AST: 123 U/L — ABNORMAL HIGH (ref 15–41)
Albumin: 2.9 g/dL — ABNORMAL LOW (ref 3.5–5.0)
Alkaline Phosphatase: 407 U/L — ABNORMAL HIGH (ref 38–126)
Anion gap: 14 (ref 5–15)
BUN: 25 mg/dL — ABNORMAL HIGH (ref 8–23)
CO2: 22 mmol/L (ref 22–32)
Calcium: 9.3 mg/dL (ref 8.9–10.3)
Chloride: 97 mmol/L — ABNORMAL LOW (ref 98–111)
Creatinine, Ser: 0.88 mg/dL (ref 0.44–1.00)
GFR calc Af Amer: >60 mL/min (ref >60)
GFR calc non Af Amer: >60 mL/min (ref >60)
Glucose, Bld: 142 mg/dL — ABNORMAL HIGH (ref 70–99)
Potassium: 4.5 mmol/L (ref 3.5–5.1)
Sodium: 133 mmol/L — ABNORMAL LOW (ref 135–145)
Total Bilirubin: 0.9 mg/dL (ref 0.3–1.2)
Total Protein: 7 g/dL (ref 6.5–8.1)

## 2019-12-28 ENCOUNTER — Encounter: Payer: Self-pay | Admitting: Oncology

## 2019-12-28 ENCOUNTER — Telehealth: Payer: Self-pay

## 2019-12-28 ENCOUNTER — Other Ambulatory Visit: Payer: Self-pay

## 2019-12-28 DIAGNOSIS — C189 Malignant neoplasm of colon, unspecified: Secondary | ICD-10-CM

## 2019-12-28 NOTE — Telephone Encounter (Signed)
Voicemail has been left with daughter, Otila Kluver, to return call. Referral will be placed to [facility]. I can make the appointment for them but do need to know the date of her arrival to [place]. Records will be faxed to [phone]

## 2019-12-28 NOTE — Progress Notes (Signed)
PSN spoke with patient's son on Thursday, 12/23/19.  Son requested assistance with transportation for the patient's upcoming appointments next week.  Referred son to [facility], who handles transportation referrals.

## 2019-12-28 NOTE — Telephone Encounter (Signed)
Appointment was arranged by someone for 01/12/20 at 1500 with Dr. Marnee Spring at [facility]. It appears she may be a breast cancer provider. Formal referral has been sent over and asked to review provider selection. Records faxed to [phone] with confirmation of receipt.

## 2019-12-29 ENCOUNTER — Encounter: Payer: Self-pay | Admitting: Oncology

## 2019-12-29 ENCOUNTER — Telehealth: Payer: Self-pay

## 2019-12-29 NOTE — Telephone Encounter (Signed)
RAS/RAF panel faxed to [facility].

## 2019-12-30 ENCOUNTER — Telehealth: Payer: Self-pay

## 2019-12-30 ENCOUNTER — Encounter: Payer: Self-pay | Admitting: Oncology

## 2019-12-30 ENCOUNTER — Other Ambulatory Visit: Payer: Self-pay | Admitting: Hospice and Palliative Medicine

## 2019-12-30 MED ORDER — HYDROCODONE-ACETAMINOPHEN 5-325 MG PO TABS: 1.0000 | ORAL_TABLET | ORAL | 0 refills | Status: AC | PRN

## 2019-12-30 NOTE — Telephone Encounter (Signed)
RAS/RAF/MSI results faxed to [facility]. Copy also provided to family in the case they do not receive.

## 2019-12-31 ENCOUNTER — Encounter: Payer: Self-pay | Admitting: Oncology

## 2020-01-04 ENCOUNTER — Other Ambulatory Visit: Payer: Medicare Other | Admitting: Nurse Practitioner

## 2020-01-04 ENCOUNTER — Other Ambulatory Visit: Payer: Self-pay

## 2020-01-04 ENCOUNTER — Other Ambulatory Visit: Payer: Self-pay | Admitting: Nurse Practitioner

## 2020-01-04 ENCOUNTER — Telehealth: Payer: Self-pay | Admitting: Nurse Practitioner

## 2020-01-04 NOTE — Telephone Encounter (Signed)
I called Ms. Rocklake phone, message left. I called Timmothy Sours, Ms. Beddow's son for scheduled PC f/u visit. Timmothy Sours endorses Ms. Cassity is moving to [place], have rented an RV to bring her there and appointment is scheduled with Oncologist. Discussed with Timmothy Sours to request PC once established with Oncology if wishes. Don in agreement. Will d/c from [facility]

## 2020-01-06 ENCOUNTER — Other Ambulatory Visit: Payer: Self-pay | Admitting: Emergency Medicine

## 2020-01-06 ENCOUNTER — Encounter: Payer: Self-pay | Admitting: Oncology

## 2020-01-06 ENCOUNTER — Inpatient Hospital Stay: Payer: Medicare Other

## 2020-01-06 MED ORDER — MORPHINE SULFATE (CONCENTRATE) 20 MG/ML PO SOLN: ORAL | 0 refills | Status: AC

## 2020-01-06 MED ORDER — ONDANSETRON 8 MG PO TBDP: 8.0000 mg | ORAL_TABLET | Freq: Three times a day (TID) | ORAL | 3 refills | Status: AC | PRN

## 2020-01-06 NOTE — Telephone Encounter (Signed)
Thank you. I have resent them.

## 2020-01-11 ENCOUNTER — Encounter: Payer: Self-pay | Admitting: Oncology

## 2020-01-20 DEATH — deceased

## 2020-10-19 IMAGING — CT CT ABDOMEN WO/W CM
2 of 11 series · 11 of 46 positions shown, 17 images · IV contrast (omnipaque)
Comparison: None.

CLINICAL DATA: Liver masses, follow-up chest CT worsening nausea
and unexplained 40 pound weight loss

EXAM:
CT ABDOMEN WITHOUT AND WITH CONTRAST
TECHNIQUE: Multidetector CT imaging of the abdomen was performed following the
standard protocol before and following the bolus administration of
intravenous contrast.
CONTRAST:  100mL OMNIPAQUE IOHEXOL 300 MG/ML  SOLN

[Series 8: coronal arterial arterial phase liver 2.00 cor · coronal · arterial · 0.56mm/px · 3 of 149 slices shown, 4 images]
[im 38/149  soft-tissue]
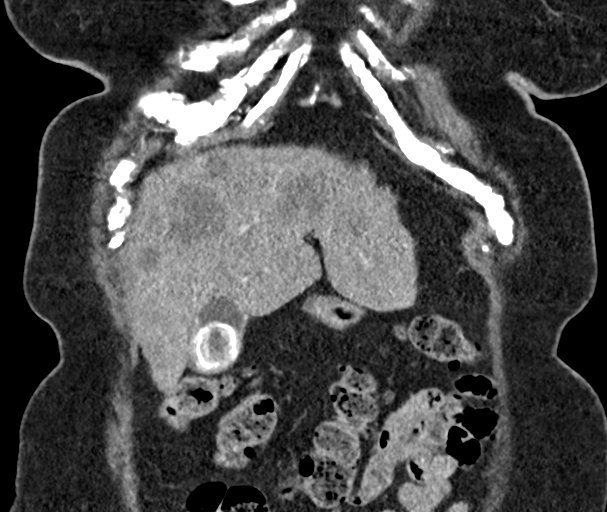
[im 75/149  soft-tissue]
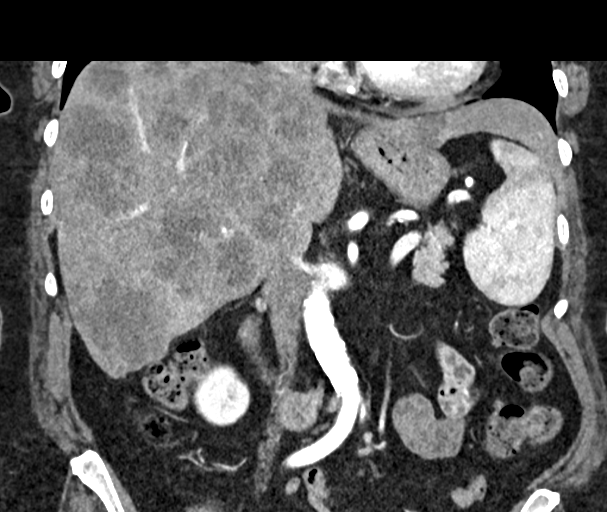
[im 75/149  bone]
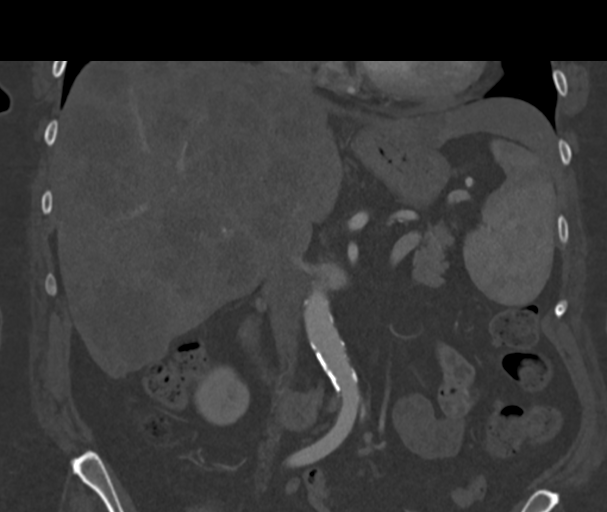
[im 112/149  soft-tissue]
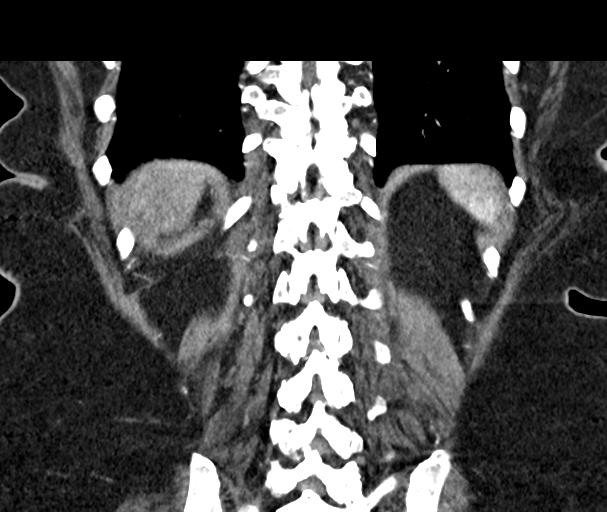

[Series 16: axial venous venous phase liver 2.00 · axial · portal-venous · 0.67mm/px · z∈[-1293,-1069]mm · 8 of 144 slices shown, 13 images]
[im 16/144  soft-tissue]
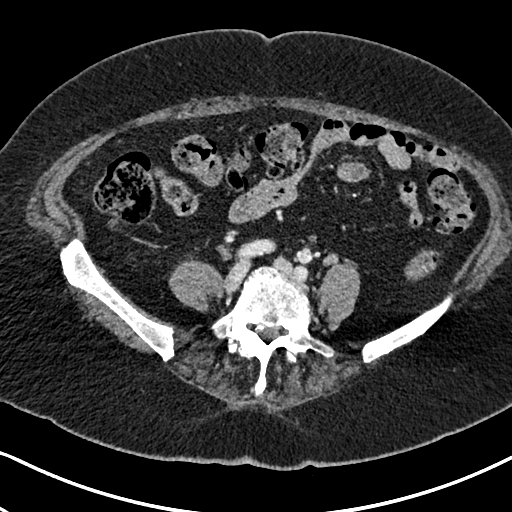
[im 16/144  bone]
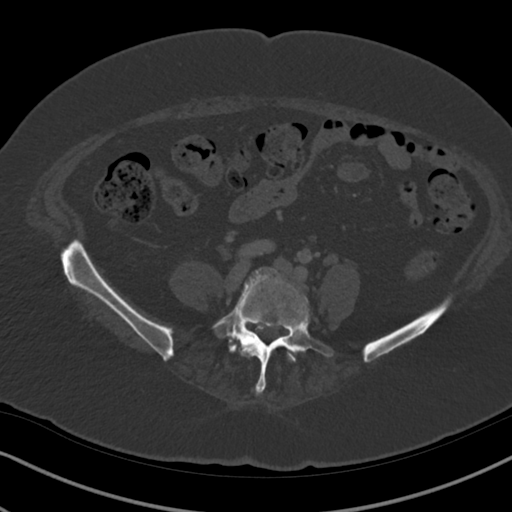
[im 32/144  soft-tissue]
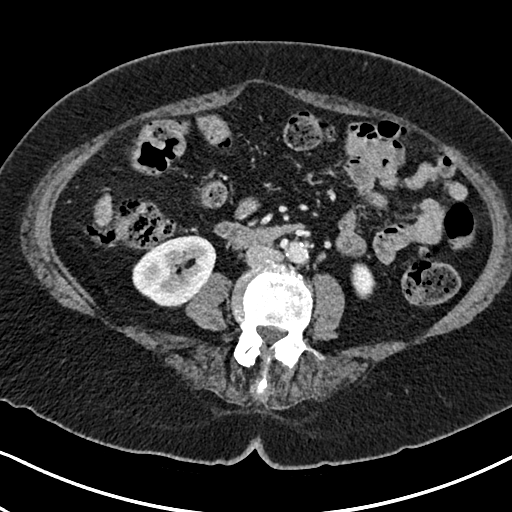
[im 48/144  soft-tissue]
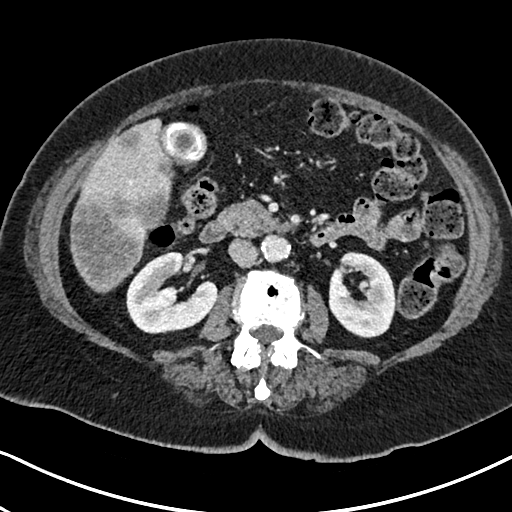
[im 64/144  soft-tissue]
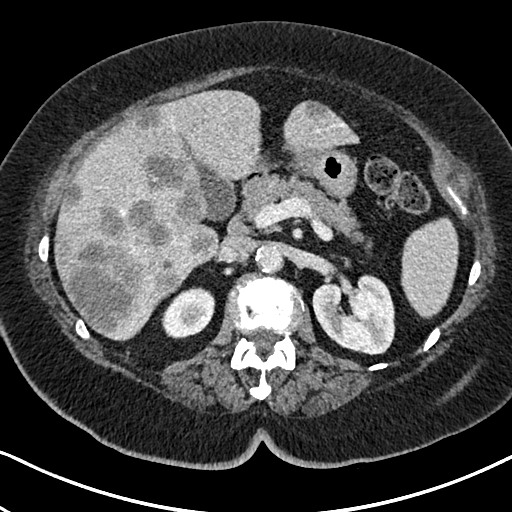
[im 80/144  soft-tissue]
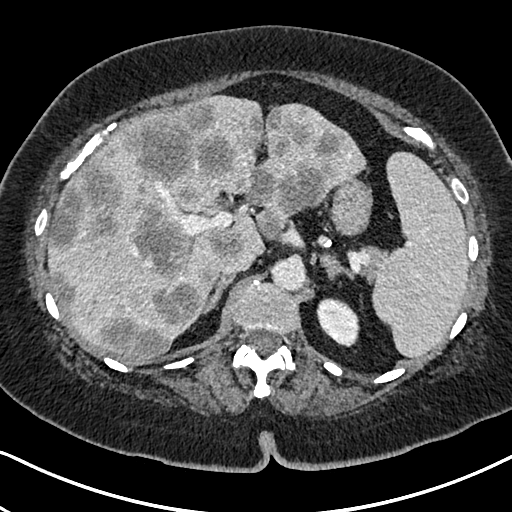
[im 80/144  lung]
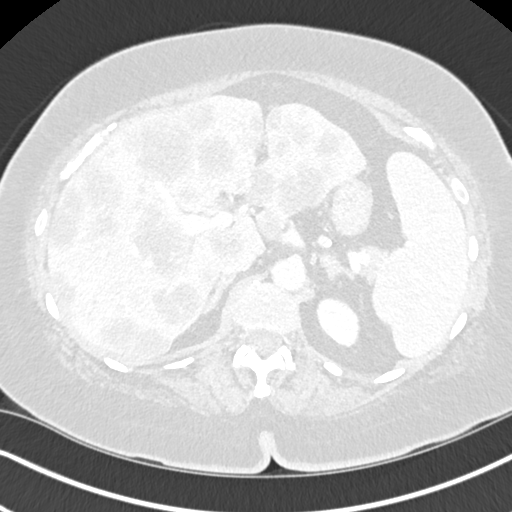
[im 96/144  soft-tissue]
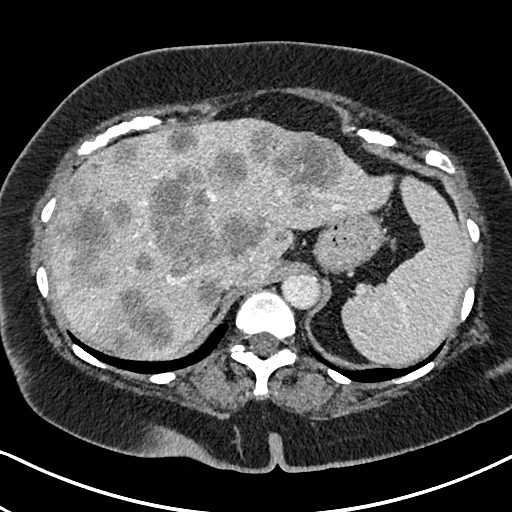
[im 96/144  lung]
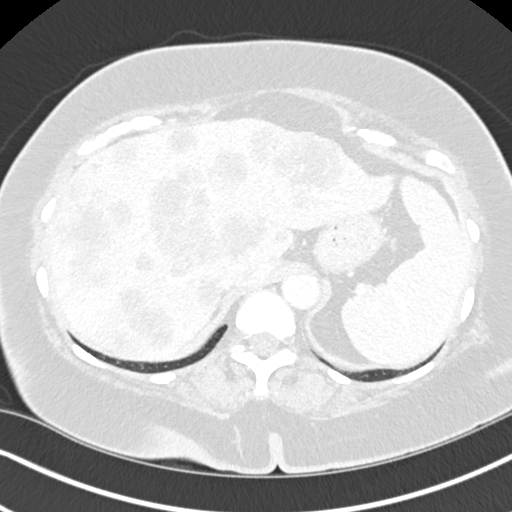
[im 112/144  soft-tissue]
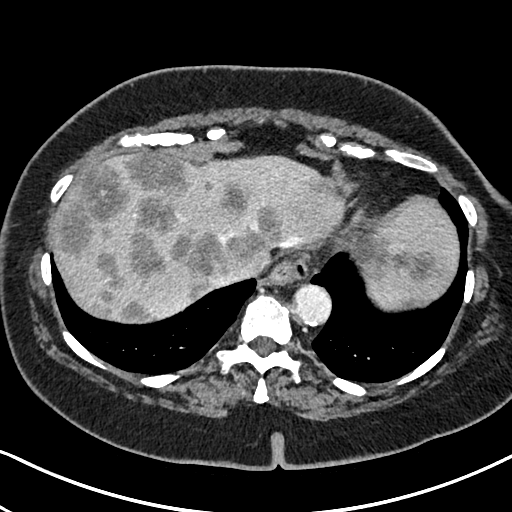
[im 112/144  lung]
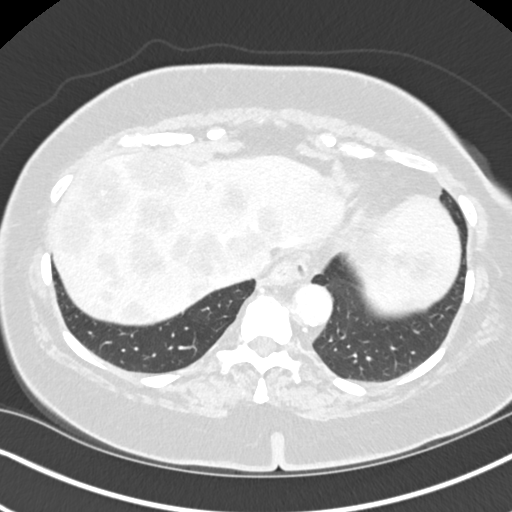
[im 128/144  soft-tissue]
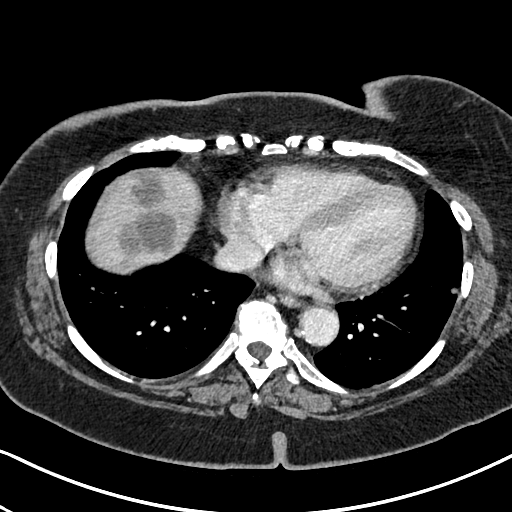
[im 128/144  lung]
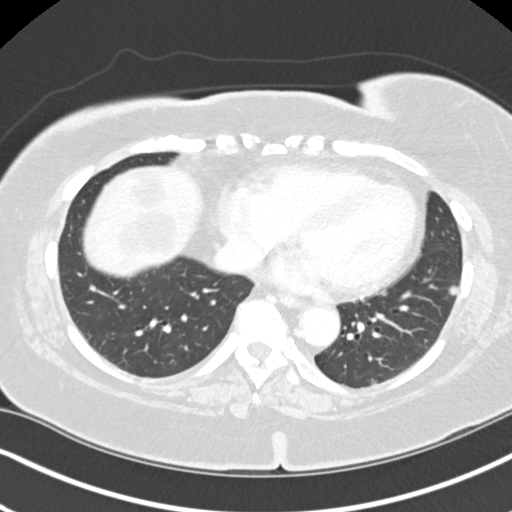

[11 of 46 positions shown; findings below may reference images not displayed]

FINDINGS: Lower chest: Multiple pulmonary nodules. Also seen on recent chest
CT.

Hepatobiliary: Numerous hepatic lesions infiltrating the liver
showing hypovascular characteristics. All hepatic subsegment are
involved.

(Image 66, series [DATE] x 3.6 cm lesion in the central right
hepatic lobe adjacent the porta hepatis.

(Image 40, series [DATE] x 3.1 cm lesion in the peripheral right
hepatic lobe along the margin of hepatic subsegment VIII.

Numerous lesions also present in the left hepatic lobe. Largest
lesion in the liver is in the inferior right hepatic lobe on series
4, image 90 measuring 6.3 x 4.8 cm.

Portal vein is patent. Gallstone in the gallbladder fundus, large
with gallbladder contracted around the gallstone and mild
gallbladder wall thickening circumferentially. No signs of
pericholecystic stranding or biliary ductal dilation.

Pancreas: Pancreas is normal.

Spleen: Spleen normal without focal lesion.

Adrenals/Urinary Tract: CT appearance of adrenal glands and kidneys
is normal no hydronephrosis, symmetric renal enhancement.

Stomach/Bowel: Is no signs of acute gastrointestinal process. Pelvic
bowel loops are not imaged. Signs of colonic diverticulosis.

Vascular/Lymphatic: Calcified atherosclerotic plaque throughout the
abdominal aorta. No signs of aneurysm.

No signs of adenopathy in the retroperitoneum or upper abdomen.

Other: No signs of ascites.

Musculoskeletal: Spinal degenerative change. No acute or destructive
bone process.
IMPRESSION: 1. Numerous hepatic lesions infiltrating the majority of the liver,
compatible with metastatic disease.
2. Multiple pulmonary nodules also suspicious for metastatic disease
as seen on recent CT chest.
3. Cholelithiasis with large gallstone in the gallbladder fundus.
4. Colonic diverticulosis.
5. Aortic atherosclerosis.

Aortic Atherosclerosis ([id number]).
